# Patient Record
Sex: Male | Born: 2000 | Race: Black or African American | Hispanic: No | Marital: Single | Smoking: Never smoker
Health system: Southern US, Community
[De-identification: ages and names within clinical notes are randomized; demographics above are authoritative.]

## PROBLEM LIST (undated history)

## (undated) DIAGNOSIS — T7840XA Allergy, unspecified, initial encounter: Secondary | ICD-10-CM

## (undated) DIAGNOSIS — J45909 Unspecified asthma, uncomplicated: Secondary | ICD-10-CM

## (undated) DIAGNOSIS — J4530 Mild persistent asthma, uncomplicated: Secondary | ICD-10-CM

## (undated) DIAGNOSIS — R51 Headache: Secondary | ICD-10-CM

## (undated) DIAGNOSIS — E739 Lactose intolerance, unspecified: Secondary | ICD-10-CM

## (undated) DIAGNOSIS — F329 Major depressive disorder, single episode, unspecified: Secondary | ICD-10-CM

## (undated) DIAGNOSIS — F32A Depression, unspecified: Secondary | ICD-10-CM

## (undated) DIAGNOSIS — F419 Anxiety disorder, unspecified: Secondary | ICD-10-CM

## (undated) HISTORY — DX: Allergy, unspecified, initial encounter: T78.40XA

## (undated) HISTORY — DX: Anxiety disorder, unspecified: F41.9

## (undated) HISTORY — PX: CIRCUMCISION: SUR203

## (undated) HISTORY — DX: Depression, unspecified: F32.A

## (undated) HISTORY — DX: Mild persistent asthma, uncomplicated: J45.30

## (undated) HISTORY — DX: Unspecified asthma, uncomplicated: J45.909

## (undated) HISTORY — DX: Headache: R51

---
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
## 2022-10-07 ENCOUNTER — Emergency Department (HOSPITAL_COMMUNITY)
Admission: EM | Admit: 2022-10-07 | Discharge: 2022-10-07 | Disposition: A | Discharge status: Home / Self Care | Payer: 59 | Attending: Emergency Medicine | Admitting: Emergency Medicine

## 2022-10-07 ENCOUNTER — Emergency Department (HOSPITAL_COMMUNITY): Payer: 59

## 2022-10-07 ENCOUNTER — Encounter (HOSPITAL_COMMUNITY): Payer: Self-pay

## 2022-10-07 ENCOUNTER — Other Ambulatory Visit: Payer: Self-pay

## 2022-10-07 DIAGNOSIS — Z7951 Long term (current) use of inhaled steroids: Secondary | ICD-10-CM | POA: Insufficient documentation

## 2022-10-07 DIAGNOSIS — M25561 Pain in right knee: Secondary | ICD-10-CM | POA: Diagnosis not present

## 2022-10-07 DIAGNOSIS — J45909 Unspecified asthma, uncomplicated: Secondary | ICD-10-CM | POA: Insufficient documentation

## 2022-10-07 DIAGNOSIS — M25562 Pain in left knee: Secondary | ICD-10-CM | POA: Insufficient documentation

## 2022-10-07 DIAGNOSIS — Y92410 Unspecified street and highway as the place of occurrence of the external cause: Secondary | ICD-10-CM | POA: Insufficient documentation

## 2022-10-07 DIAGNOSIS — R1031 Right lower quadrant pain: Secondary | ICD-10-CM | POA: Insufficient documentation

## 2022-10-07 DIAGNOSIS — G8911 Acute pain due to trauma: Secondary | ICD-10-CM | POA: Diagnosis not present

## 2022-10-07 DIAGNOSIS — S39012A Strain of muscle, fascia and tendon of lower back, initial encounter: Secondary | ICD-10-CM

## 2022-10-07 DIAGNOSIS — Z91010 Allergy to peanuts: Secondary | ICD-10-CM | POA: Insufficient documentation

## 2022-10-07 DIAGNOSIS — S161XXA Strain of muscle, fascia and tendon at neck level, initial encounter: Secondary | ICD-10-CM | POA: Diagnosis not present

## 2022-10-07 DIAGNOSIS — M546 Pain in thoracic spine: Secondary | ICD-10-CM | POA: Diagnosis not present

## 2022-10-07 DIAGNOSIS — M542 Cervicalgia: Secondary | ICD-10-CM | POA: Diagnosis present

## 2022-10-07 LAB — BASIC METABOLIC PANEL
Anion gap: 7 (ref 5–15)
BUN: 15 mg/dL (ref 6–20)
CO2: 27 mmol/L (ref 22–32)
Calcium: 9.5 mg/dL (ref 8.9–10.3)
Chloride: 104 mmol/L (ref 98–111)
Creatinine, Ser: 1.08 mg/dL (ref 0.61–1.24)
GFR, Estimated: >60 mL/min (ref >60)
Glucose, Bld: 89 mg/dL (ref 70–99)
Potassium: 3.9 mmol/L (ref 3.5–5.1)
Sodium: 138 mmol/L (ref 135–145)

## 2022-10-07 LAB — CBC WITH DIFFERENTIAL/PLATELET
Abs Immature Granulocytes: 0.01 10*3/uL (ref 0.00–0.07)
Basophils Absolute: 0 10*3/uL (ref 0.0–0.1)
Basophils Relative: 1 %
Eosinophils Absolute: 0 10*3/uL (ref 0.0–0.5)
Eosinophils Relative: 1 %
HCT: 46.5 % (ref 39.0–52.0)
Hemoglobin: 15.8 g/dL (ref 13.0–17.0)
Immature Granulocytes: 0 %
Lymphocytes Relative: 27 %
Lymphs Abs: 1.3 10*3/uL (ref 0.7–4.0)
MCH: 27.6 pg (ref 26.0–34.0)
MCHC: 34 g/dL (ref 30.0–36.0)
MCV: 81.3 fL (ref 80.0–100.0)
Monocytes Absolute: 0.3 10*3/uL (ref 0.1–1.0)
Monocytes Relative: 7 %
Neutro Abs: 3.3 10*3/uL (ref 1.7–7.7)
Neutrophils Relative %: 64 %
Platelets: 222 10*3/uL (ref 150–400)
RBC: 5.72 MIL/uL (ref 4.22–5.81)
RDW: 12.9 % (ref 11.5–15.5)
WBC: 5 10*3/uL (ref 4.0–10.5)
nRBC: 0 % (ref 0.0–0.2)

## 2022-10-07 MED ORDER — NAPROXEN 500 MG PO TABS: 500.0000 mg | ORAL_TABLET | Freq: Two times a day (BID) | ORAL | 0 refills | Status: DC

## 2022-10-07 MED ORDER — MORPHINE SULFATE (PF) 2 MG/ML IV SOLN
2.0000 mg | Freq: Once | INTRAVENOUS | Status: AC
  Administered 2022-10-07: 2 mg via INTRAVENOUS
  Filled 2022-10-07: qty 1

## 2022-10-07 MED ORDER — HYDROCODONE-ACETAMINOPHEN 5-325 MG PO TABS: 1.0000 | ORAL_TABLET | ORAL | 0 refills | Status: DC | PRN

## 2022-10-07 MED ORDER — ONDANSETRON HCL 4 MG/2ML IJ SOLN
4.0000 mg | Freq: Once | INTRAMUSCULAR | Status: AC
  Administered 2022-10-07: 4 mg via INTRAVENOUS
  Filled 2022-10-07: qty 2

## 2022-10-07 MED ORDER — HYDROCODONE-ACETAMINOPHEN 5-325 MG PO TABS
1.0000 | ORAL_TABLET | Freq: Once | ORAL | Status: AC
  Administered 2022-10-07: 1 via ORAL
  Filled 2022-10-07: qty 1

## 2022-10-07 MED ORDER — IOHEXOL 300 MG/ML  SOLN
100.0000 mL | Freq: Once | INTRAMUSCULAR | Status: AC | PRN
  Administered 2022-10-07: 100 mL via INTRAVENOUS

## 2022-10-07 NOTE — ED Notes (Signed)
Patient transported to CT 
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
## 2022-10-07 NOTE — Discharge Instructions (Addendum)
Prepack of hydrocodone provided to help with more severe pain.  Once not taking hydrocodone recommend extra strength Tylenol to be taken along with the Naprosyn.  You can take the Naprosyn along with the hydrocodone.  Would recommend the Naprosyn every 12 hours and the extra strength Tylenol every 8 hours.  Expect to be sore and stiff the next few days.  If the knees are still bothering you after about a week orthopedic information locally provided above it can call and set up an appointment.  Head CT, CT neck, CT chest abdomen and pelvis and x-rays of both knees without evidence of any bony injury or internal organ injury.

## 2022-10-07 NOTE — ED Notes (Signed)
Pt alert, oriented upon DC. He was given the vicodin prepack.  [place] Heath Gerrianne Scale

## 2022-10-07 NOTE — ED Notes (Signed)
ED Provider at bedside. 

## 2022-10-08 MED FILL — Hydrocodone-Acetaminophen Tab 5-325 MG: ORAL | Qty: 6 | Status: AC

## 2023-11-01 ENCOUNTER — Other Ambulatory Visit: Payer: Self-pay

## 2023-11-01 ENCOUNTER — Encounter (HOSPITAL_BASED_OUTPATIENT_CLINIC_OR_DEPARTMENT_OTHER): Payer: Self-pay | Admitting: Orthopedic Surgery

## 2023-11-07 NOTE — Anesthesia Preprocedure Evaluation (Addendum)
Anesthesia Evaluation  Patient identified by MRN, date of birth, ID band Patient awake    Reviewed: Allergy & Precautions, NPO status , Patient's Chart, lab work & pertinent test results  Airway Mallampati: II  TM Distance: >3 FB Neck ROM: Full    Dental  (+) Teeth Intact, Dental Advisory Given   Pulmonary asthma    breath sounds clear to auscultation       Cardiovascular negative cardio ROS  Rhythm:Regular Rate:Normal     Neuro/Psych  Headaches PSYCHIATRIC DISORDERS Anxiety Depression       GI/Hepatic negative GI ROS, Neg liver ROS,,,  Endo/Other  negative endocrine ROS    Renal/GU negative Renal ROS     Musculoskeletal   Abdominal   Peds  Hematology negative hematology ROS (+)   Anesthesia Other Findings   Reproductive/Obstetrics                             Anesthesia Physical Anesthesia Plan  ASA: 1  Anesthesia Plan: General   Post-op Pain Management: Regional block*   Induction: Intravenous  PONV Risk Score and Plan: 3 and Ondansetron, Dexamethasone and Midazolam  Airway Management Planned: LMA  Additional Equipment: None  Intra-op Plan:   Post-operative Plan: Extubation in OR  Informed Consent: I have reviewed the patients History and Physical, chart, labs and discussed the procedure including the risks, benefits and alternatives for the proposed anesthesia with the patient or authorized representative who has indicated his/her understanding and acceptance.     Dental advisory given  Plan Discussed with: CRNA  Anesthesia Plan Comments:        Anesthesia Quick Evaluation

## 2023-11-08 ENCOUNTER — Ambulatory Visit (HOSPITAL_BASED_OUTPATIENT_CLINIC_OR_DEPARTMENT_OTHER): Payer: 59 | Admitting: Anesthesiology

## 2023-11-08 ENCOUNTER — Other Ambulatory Visit: Payer: Self-pay

## 2023-11-08 ENCOUNTER — Ambulatory Visit (HOSPITAL_BASED_OUTPATIENT_CLINIC_OR_DEPARTMENT_OTHER)
Admission: RE | Admit: 2023-11-08 | Discharge: 2023-11-08 | Disposition: A | Discharge status: Home / Self Care | Payer: 59 | Attending: Orthopedic Surgery | Admitting: Orthopedic Surgery

## 2023-11-08 ENCOUNTER — Encounter (HOSPITAL_BASED_OUTPATIENT_CLINIC_OR_DEPARTMENT_OTHER)
Admission: RE | Disposition: A | Discharge status: Home / Self Care | Payer: Self-pay | Source: Home / Self Care | Attending: Orthopedic Surgery

## 2023-11-08 ENCOUNTER — Ambulatory Visit (HOSPITAL_COMMUNITY): Payer: 59

## 2023-11-08 ENCOUNTER — Encounter (HOSPITAL_BASED_OUTPATIENT_CLINIC_OR_DEPARTMENT_OTHER): Payer: Self-pay | Admitting: Orthopedic Surgery

## 2023-11-08 DIAGNOSIS — M222X2 Patellofemoral disorders, left knee: Secondary | ICD-10-CM | POA: Diagnosis present

## 2023-11-08 DIAGNOSIS — M92522 Juvenile osteochondrosis of tibia tubercle, left leg: Secondary | ICD-10-CM | POA: Insufficient documentation

## 2023-11-08 DIAGNOSIS — J4530 Mild persistent asthma, uncomplicated: Secondary | ICD-10-CM | POA: Diagnosis not present

## 2023-11-08 DIAGNOSIS — M7652 Patellar tendinitis, left knee: Secondary | ICD-10-CM | POA: Diagnosis not present

## 2023-11-08 DIAGNOSIS — M9242 Juvenile osteochondrosis of patella, left knee: Secondary | ICD-10-CM | POA: Insufficient documentation

## 2023-11-08 HISTORY — PX: ORIF PATELLA: SHX5033

## 2023-11-08 SURGERY — OPEN REDUCTION INTERNAL FIXATION (ORIF) PATELLA
Anesthesia: General | Site: Knee | Laterality: Left

## 2023-11-08 MED ORDER — SODIUM CHLORIDE 0.9 % IV SOLN: INTRAVENOUS | Status: DC | PRN

## 2023-11-08 MED ORDER — DEXMEDETOMIDINE HCL IN NACL 80 MCG/20ML IV SOLN
INTRAVENOUS | Status: DC | PRN
  Administered 2023-11-08: 12 ug via INTRAVENOUS

## 2023-11-08 MED ORDER — ACETAMINOPHEN 160 MG/5ML PO SOLN: 325.0000 mg | ORAL | Status: DC | PRN

## 2023-11-08 MED ORDER — LIDOCAINE 2% (20 MG/ML) 5 ML SYRINGE
INTRAMUSCULAR | Status: AC
Start: 2023-11-08 — End: ?
  Filled 2023-11-08: qty 5

## 2023-11-08 MED ORDER — ACETAMINOPHEN 325 MG PO TABS: 325.0000 mg | ORAL_TABLET | ORAL | Status: DC | PRN

## 2023-11-08 MED ORDER — MIDAZOLAM HCL 2 MG/2ML IJ SOLN
2.0000 mg | Freq: Once | INTRAMUSCULAR | Status: AC
  Administered 2023-11-08: 2 mg via INTRAVENOUS

## 2023-11-08 MED ORDER — MIDAZOLAM HCL 2 MG/2ML IJ SOLN
INTRAMUSCULAR | Status: AC
  Filled 2023-11-08: qty 2

## 2023-11-08 MED ORDER — ONDANSETRON HCL 4 MG PO TABS: 4.0000 mg | ORAL_TABLET | Freq: Three times a day (TID) | ORAL | 0 refills | Status: DC | PRN

## 2023-11-08 MED ORDER — ONDANSETRON HCL 4 MG/2ML IJ SOLN
INTRAMUSCULAR | Status: DC | PRN
  Administered 2023-11-08: 4 mg via INTRAVENOUS

## 2023-11-08 MED ORDER — OXYCODONE HCL 5 MG/5ML PO SOLN: 5.0000 mg | Freq: Once | ORAL | Status: AC | PRN

## 2023-11-08 MED ORDER — ACETAMINOPHEN 10 MG/ML IV SOLN
1000.0000 mg | Freq: Once | INTRAVENOUS | Status: DC | PRN
Start: 2023-11-08 — End: 2023-11-08
  Administered 2023-11-08: 1000 mg via INTRAVENOUS

## 2023-11-08 MED ORDER — FENTANYL CITRATE (PF) 100 MCG/2ML IJ SOLN
INTRAMUSCULAR | Status: AC
  Filled 2023-11-08: qty 2

## 2023-11-08 MED ORDER — FENTANYL CITRATE (PF) 100 MCG/2ML IJ SOLN: 25.0000 ug | INTRAMUSCULAR | Status: DC | PRN

## 2023-11-08 MED ORDER — LACTATED RINGERS IV SOLN: INTRAVENOUS | Status: DC

## 2023-11-08 MED ORDER — FENTANYL CITRATE (PF) 100 MCG/2ML IJ SOLN
INTRAMUSCULAR | Status: DC | PRN
  Administered 2023-11-08 (×2): 50 ug via INTRAVENOUS

## 2023-11-08 MED ORDER — DEXAMETHASONE SODIUM PHOSPHATE 10 MG/ML IJ SOLN
INTRAMUSCULAR | Status: AC
  Filled 2023-11-08: qty 1

## 2023-11-08 MED ORDER — 0.9 % SODIUM CHLORIDE (POUR BTL) OPTIME
TOPICAL | Status: DC | PRN
  Administered 2023-11-08: 1000 mL

## 2023-11-08 MED ORDER — HYDROCODONE-ACETAMINOPHEN 5-325 MG PO TABS: 1.0000 | ORAL_TABLET | ORAL | 0 refills | Status: DC | PRN

## 2023-11-08 MED ORDER — DEXMEDETOMIDINE HCL IN NACL 80 MCG/20ML IV SOLN
INTRAVENOUS | Status: AC
  Filled 2023-11-08: qty 20

## 2023-11-08 MED ORDER — LIDOCAINE 2% (20 MG/ML) 5 ML SYRINGE
INTRAMUSCULAR | Status: DC | PRN
  Administered 2023-11-08: 60 mg via INTRAVENOUS

## 2023-11-08 MED ORDER — CEFAZOLIN SODIUM-DEXTROSE 2-4 GM/100ML-% IV SOLN
INTRAVENOUS | Status: AC
Start: 2023-11-08 — End: ?
  Filled 2023-11-08: qty 100

## 2023-11-08 MED ORDER — OXYCODONE HCL 5 MG PO TABS
5.0000 mg | ORAL_TABLET | Freq: Once | ORAL | Status: AC | PRN
  Administered 2023-11-08: 5 mg via ORAL

## 2023-11-08 MED ORDER — PROPOFOL 10 MG/ML IV BOLUS
INTRAVENOUS | Status: DC | PRN
  Administered 2023-11-08: 200 mg via INTRAVENOUS

## 2023-11-08 MED ORDER — FENTANYL CITRATE (PF) 100 MCG/2ML IJ SOLN
50.0000 ug | Freq: Once | INTRAMUSCULAR | Status: AC
  Administered 2023-11-08: 50 ug via INTRAVENOUS

## 2023-11-08 MED ORDER — ACETAMINOPHEN 10 MG/ML IV SOLN
INTRAVENOUS | Status: AC
  Filled 2023-11-08: qty 100

## 2023-11-08 MED ORDER — ONDANSETRON HCL 4 MG/2ML IJ SOLN
INTRAMUSCULAR | Status: AC
  Filled 2023-11-08: qty 2

## 2023-11-08 MED ORDER — OXYCODONE HCL 5 MG PO TABS
ORAL_TABLET | ORAL | Status: AC
  Filled 2023-11-08: qty 1

## 2023-11-08 MED ORDER — CEFAZOLIN SODIUM-DEXTROSE 2-4 GM/100ML-% IV SOLN
2.0000 g | INTRAVENOUS | Status: AC
  Administered 2023-11-08: 2 g via INTRAVENOUS

## 2023-11-08 MED ORDER — DROPERIDOL 2.5 MG/ML IJ SOLN: 0.6250 mg | Freq: Once | INTRAMUSCULAR | Status: DC | PRN

## 2023-11-08 MED ORDER — DEXAMETHASONE SODIUM PHOSPHATE 10 MG/ML IJ SOLN
INTRAMUSCULAR | Status: DC | PRN
  Administered 2023-11-08: 10 mg via INTRAVENOUS

## 2023-11-08 SURGICAL SUPPLY — 66 items
ANCHOR SUT 1.8 FIBERTAK SB KL (Anchor) IMPLANT
BANDAGE ESMARK 6X9 LF (GAUZE/BANDAGES/DRESSINGS) ×2 IMPLANT
BLADE SURG 15 STRL LF DISP TIS (BLADE) ×4 IMPLANT
BNDG ELASTIC 4INX 5YD STR LF (GAUZE/BANDAGES/DRESSINGS) ×2 IMPLANT
BNDG ELASTIC 6INX 5YD STR LF (GAUZE/BANDAGES/DRESSINGS) ×2 IMPLANT
BNDG ESMARK 6X9 LF (GAUZE/BANDAGES/DRESSINGS) ×1
BNDG GAUZE DERMACEA FLUFF 4 (GAUZE/BANDAGES/DRESSINGS) IMPLANT
CANISTER SUCT 1200ML W/VALVE (MISCELLANEOUS) ×2 IMPLANT
CLSR STERI-STRIP ANTIMIC 1/2X4 (GAUZE/BANDAGES/DRESSINGS) IMPLANT
CUFF TRNQT CYL 34X4.125X (TOURNIQUET CUFF) IMPLANT
DRAPE C-ARM 42X72 X-RAY (DRAPES) ×2 IMPLANT
DRAPE C-ARMOR (DRAPES) ×2 IMPLANT
DRAPE EXTREMITY T 121X128X90 (DISPOSABLE) ×2 IMPLANT
DRAPE INCISE IOBAN 66X45 STRL (DRAPES) IMPLANT
DRAPE OEC MINIVIEW 54X84 (DRAPES) IMPLANT
DRAPE U-SHAPE 47X51 STRL (DRAPES) ×2 IMPLANT
DRSG AQUACEL AG ADV 3.5X 6 (GAUZE/BANDAGES/DRESSINGS) IMPLANT
DRSG AQUACEL AG ADV 3.5X10 (GAUZE/BANDAGES/DRESSINGS) IMPLANT
DURAPREP 26ML APPLICATOR (WOUND CARE) ×2 IMPLANT
ELECT REM PT RETURN 9FT ADLT (ELECTROSURGICAL) ×1
ELECTRODE REM PT RTRN 9FT ADLT (ELECTROSURGICAL) ×2 IMPLANT
GAUZE PAD ABD 8X10 STRL (GAUZE/BANDAGES/DRESSINGS) IMPLANT
GAUZE SPONGE 4X4 12PLY STRL (GAUZE/BANDAGES/DRESSINGS) ×2 IMPLANT
GLOVE BIO SURGEON STRL SZ7.5 (GLOVE) ×4 IMPLANT
GLOVE BIOGEL PI IND STRL 8 (GLOVE) ×4 IMPLANT
GOWN STRL REUS W/ TWL LRG LVL3 (GOWN DISPOSABLE) ×2 IMPLANT
GOWN STRL REUS W/ TWL XL LVL3 (GOWN DISPOSABLE) ×4 IMPLANT
IMMOBILIZER KNEE 22 UNIV (SOFTGOODS) IMPLANT
IMMOBILIZER KNEE 24 THIGH 36 (MISCELLANEOUS) IMPLANT
IMMOBILIZER KNEE 24 UNIV (MISCELLANEOUS)
KIT STR SPEAR 1.8 FBRTK DISP (KITS) IMPLANT
NDL KEITH (NEEDLE) IMPLANT
NDL TAPERED W/ NITINOL LOOP (MISCELLANEOUS) IMPLANT
NEEDLE KEITH (NEEDLE)
NEEDLE TAPERED W/ NITINOL LOOP (MISCELLANEOUS) ×1
NS IRRIG 1000ML POUR BTL (IV SOLUTION) ×2 IMPLANT
PACK ARTHROSCOPY DSU (CUSTOM PROCEDURE TRAY) ×2 IMPLANT
PACK BASIN DAY SURGERY FS (CUSTOM PROCEDURE TRAY) ×2 IMPLANT
PAD CAST 4YDX4 CTTN HI CHSV (CAST SUPPLIES) ×2 IMPLANT
PADDING CAST ABS COTTON 4X4 ST (CAST SUPPLIES) ×2 IMPLANT
PASSER SUT SWANSON 36MM LOOP (INSTRUMENTS) IMPLANT
PENCIL SMOKE EVACUATOR (MISCELLANEOUS) ×2 IMPLANT
SLEEVE SCD COMPRESS KNEE MED (STOCKING) ×2 IMPLANT
SPIKE FLUID TRANSFER (MISCELLANEOUS) IMPLANT
SPONGE T-LAP 18X18 ~~LOC~~+RFID (SPONGE) ×2 IMPLANT
STAPLER SKIN PROX WIDE 3.9 (STAPLE) IMPLANT
STRIP CLOSURE SKIN 1/2X4 (GAUZE/BANDAGES/DRESSINGS) IMPLANT
SUCTION TUBE FRAZIER 10FR DISP (SUCTIONS) ×2 IMPLANT
SUT ETHILON 3 0 PS 1 (SUTURE) IMPLANT
SUT ETHILON 4 0 PS 2 18 (SUTURE) IMPLANT
SUT FIBERWIRE #2 38 T-5 BLUE (SUTURE)
SUT FIBERWIRE #5 38 CONV NDL (SUTURE)
SUT MNCRL AB 3-0 PS2 18 (SUTURE) ×2 IMPLANT
SUT MNCRL AB 4-0 PS2 18 (SUTURE) IMPLANT
SUT VIC AB 0 CT1 27XBRD ANBCTR (SUTURE) ×2 IMPLANT
SUT VIC AB 2-0 CT1 TAPERPNT 27 (SUTURE) ×2 IMPLANT
SUT VIC AB 2-0 SH 18 (SUTURE) IMPLANT
SUT VIC AB 2-0 SH 27XBRD (SUTURE) IMPLANT
SUT VIC AB 3-0 SH 27X BRD (SUTURE) IMPLANT
SUT VIC AB 4-0 PS2 18 (SUTURE) IMPLANT
SUT VICRYL 3-0 CR8 SH (SUTURE) IMPLANT
SUTURE FIBERWR #2 38 T-5 BLUE (SUTURE) IMPLANT
SUTURE FIBERWR #5 38 CONV NDL (SUTURE) IMPLANT
SYR BULB EAR ULCER 3OZ GRN STR (SYRINGE) ×2 IMPLANT
UNDERPAD 30X36 HEAVY ABSORB (UNDERPADS AND DIAPERS) ×2 IMPLANT
YANKAUER SUCT BULB TIP NO VENT (SUCTIONS) ×2 IMPLANT

## 2023-11-08 NOTE — Transfer of Care (Signed)
Immediate Anesthesia Transfer of Care Note  Patient: [name]  Procedure(s) Performed: KNEE OPEN PATELLA DEBRIDEMENT WITH TIBIAL AND PATELLA OSTECTOMY (Left: Knee)  Patient Location: PACU  Anesthesia Type:General and GA combined with regional for post-op pain  Level of Consciousness: drowsy, patient cooperative, and responds to stimulation  Airway & Oxygen Therapy: Patient Spontanous Breathing  Post-op Assessment: Report given to RN and Post -op Vital signs reviewed and stable  Post vital signs: Reviewed and stable  Last Vitals:  Vitals Value Taken Time  BP 133/74 11/08/23 0845  Temp 36.3 C 11/08/23 0845  Pulse 83 11/08/23 0851  Resp 0 11/08/23 0851  SpO2 100 % 11/08/23 0851  Vitals shown include unfiled device data.  Last Pain:  Vitals:   11/08/23 0845  TempSrc:   PainSc: Asleep      Patients Stated Pain Goal: 4 (11/08/23 7846)  Complications: No notable events documented.

## 2023-11-08 NOTE — Progress Notes (Signed)
Patient requested bone from knee, verified with charge nurse and it is ok as the bone is patients. Bone delivered to PACU nurse in a cup with lid and in bag

## 2023-11-08 NOTE — Anesthesia Procedure Notes (Signed)
Procedure Name: LMA Insertion Date/Time: 11/08/2023 7:42 AM  Performed by: Yolanda Bonine, CRNAPre-anesthesia Checklist: Patient identified, Emergency Drugs available, Suction available, Patient being monitored and Timeout performed Patient Re-evaluated:Patient Re-evaluated prior to induction Oxygen Delivery Method: Circle system utilized Preoxygenation: Pre-oxygenation with 100% oxygen Induction Type: IV induction Ventilation: Mask ventilation without difficulty LMA: LMA inserted LMA Size: 5.0 Dental Injury: Teeth and Oropharynx as per pre-operative assessment

## 2023-11-08 NOTE — Op Note (Signed)
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  Now dealing with persistent pain at the inferior pole of the patella as well as tibial tubercle from persistent inflammation of those regions.  Here today for open debridement as well as bone excision.;  The patient did consent to the procedure after discussion of the risks and benefits.  PREOPERATIVE DIAGNOSIS:  1.  Left knee inferior pole of patella symptomatic ossicle 2.  Left knee proximal tibia symptomatic ossicle at patella tendon enthesis 3.  Left knee patella tendinosis  POSTOPERATIVE DIAGNOSIS: Same.  PROCEDURE:  1.  Left knee open inferior pole of patella ostectomy 2.  Left knee open proximal tibia ostectomy 3.  Left knee open patellar tendon debridement  SURGEON: Maryan Rued, M.D.  ASSIST: Dion Saucier, PA-C  Assistant attestation:  PA McClung present for the entire procedure..  ANESTHESIA:  general, adductor canal  IV FLUIDS AND URINE: See anesthesia.  ESTIMATED BLOOD LOSS: 5 cc  IMPLANTS: One 1.8 mm Arthrex fiber tack knotless anchor  DRAINS: None  COMPLICATIONS: None.  DESCRIPTION OF PROCEDURE: The patient was brought to the operating room and placed supine on the operating table.  The patient had been signed prior to the procedure and this was documented. The patient had the anesthesia placed by the anesthesiologist.  A time-out was performed to confirm that this was the correct patient, site, side and location. The patient did receive antibiotics prior to the incision and was re-dosed during the procedure as needed at indicated intervals.  A tourniquet was placed.  The patient had the operative extremity prepped and draped in the standard surgical fashion.     Following prep and drape of the left lower extremity tourniquet was inflated.  We then established a slightly  anterior medial incision along the patella tendon to avoid an anterior scar.  Dissection was carried down through skin and subcutaneous tissue to level of the extensor mechanism.  Peritenon was elevated sharply with a 15 blade.  This was preserved for later repair.  Next, we split the patella tendon in line with its fibers from the inferior pole of the patella to the proximal tibia.  We immediately encountered a large ossific fragment off the inferior pole of the patella.  This was noted to be joint to the patella with a synchondrosis.  We used a osteotome to to break free the synchondrosis at that level.  We then used a rondure to remove the inferior pole of the patella ossicle.  This completed the patella ostectomy  Next, we moved down our attention to the proximal tibia.  Again sharply with 15 blade we debrided removed patella tendon tissue from the ossific fragment of the proximal tibia.  We then identified this again connected with a synchondrosis to the proximal tibia.  Rondure was used to remove this en bloc.  This completed the proximal tibia ostectomy.  Next, we sharply debrided the distal and proximal patella tendon with 15 blade.  We removed any degenerative tissue.  We also elicited bleeding to the deep bone.  Along the tibial attachment we placed an anchor a 1.8 mm knotless fiber tack anchor at the apex of the tendon split.  We then used the working stitch to pass baseball style watertight closure to the distal tendon.  This created a nice closure to bone.  Lastly we began our closure.  We copiously irrigated the  wound with normal saline.  We then closed tendon with 2-0 Vicryl.  Peritenon closed with 3-0 running locking Vicryl.  Skin closed with 2-0 Vicryl for deep dermal layer and 3-0 Monocryl for skin.  The leg was cleaned and dried and standard sterile bandage was applied.  We was placed in a knee immobilizer.  He was extubated and awakened from general anesthetic in stable condition.  No  noted intraoperative complications.  All counts were correct x 2.  POSTOPERATIVE PLAN:  Spencer Hall will be weightbearing as tolerated immediately.  However will be in his knee immobilizer for 1 to 2 weeks to protect the knee from buckling.  He can use crutches as necessary.  He will take an 81 mg aspirin once per day x 6 weeks.  I will see him in the office in 2 weeks.  Discharge home today.

## 2023-11-08 NOTE — Discharge Instructions (Addendum)
Post-operative patient instructions  Knee surgery  Ice:  Place intermittent ice or cooler pack over your knee, 30 minutes on and 30 minutes off.  Continue this for the first 72 hours after surgery, then save ice for use after therapy sessions or on more active days.   Weight:  You may bear weight on your leg as your symptoms allow. Knee brace: Wear your knee immobilizer for the first 1 week while weightbearing to prevent knee buckling. DVT prevention: Perform ankle pumps as able throughout the day on the operative extremity.  Be mobile as possible with ambulation as able.  You should also take an 81 mg aspirin once per day x6 weeks. Crutches:  Use crutches (or walker) to assist in walking until told to discontinue by your physical therapist or physician. This will help to reduce pain. Strengthening:  Perform simple thigh squeezes (isometric quad contractions) and straight leg lifts as you are able (3 sets of 5 to 10 repetitions, 3 times a day).  For the leg lifts, have someone support under your ankle in the beginning until you have increased strength enough to do this on your own.  To help get started on thigh squeezes, place a pillow under your knee and push down on the pillow with back of knee (sometimes easier to do than with your leg fully straight). Motion:  Perform gentle knee motion as tolerated - this is gentle bending and straightening of the knee. Seated heel slides: you can start by sitting in a chair, remove your brace, and gently slide your heel back on the floor - allowing your knee to bend. Have someone help you straighten your knee (or use your other leg/foot hooked under your ankle.  Dressing:  Perform 1st dressing change at 3 days postoperative. A moderate amount of blood tinged drainage is to be expected.  -Okay to begin showering postoperative day #3.  Please do not submerge underwater until you are seen back in the office for follow-up. Pain medication:  A narcotic pain medication  has been prescribed.  Take as directed.  Typically you need narcotic pain medication more regularly during the first 3 to 5 days after surgery.  Decrease your use of the medication as the pain improves.  Narcotics can sometimes cause constipation, even after a few doses.  If you have problems with constipation, you can take an over the counter stool softener or light laxative.  If you have persistent problems, please notify your physician's office. Physical therapy: Additional activity guidelines to be provided by your physician or physical therapist at follow-up visits.  Driving: Do not recommend driving x 1-2 weeks post surgical, especially if surgery performed on right side. Should not drive while taking narcotic pain medications. It typically takes at least 2 weeks to restore sufficient neuromuscular function for normal reaction times for driving safety.  Call [phone] for questions or problems. Evenings you will be forwarded to the hospital operator.  Ask for the orthopaedic physician on call. Please call if you experience:    Redness, foul smelling, or persistent drainage from the surgical site  worsening knee pain and swelling not responsive to medication  any calf pain and or swelling of the lower leg  temperatures greater than 101.5 F other questions or concerns   Thank you for allowing [place] to be a part of your care   Post Anesthesia Home Care Instructions  Activity: Get plenty of rest for the remainder of the day. A responsible individual must stay with you  for 24 hours following the procedure.  For the next 24 hours, DO NOT: -Drive a car -Advertising copywriter -Drink alcoholic beverages -Take any medication unless instructed by your physician -Make any legal decisions or sign important papers.  Meals: Start with liquid foods such as gelatin or soup. Progress to regular foods as tolerated. Avoid greasy, spicy, heavy foods. If nausea and/or vomiting occur, drink only clear liquids  until the nausea and/or vomiting subsides. Call your physician if vomiting continues.  Special Instructions/Symptoms: Your throat may feel dry or sore from the anesthesia or the breathing tube placed in your throat during surgery. If this causes discomfort, gargle with warm salt water. The discomfort should disappear within 24 hours.  Regional Anesthesia Blocks  1. You may not be able to move or feel the "blocked" extremity after a regional anesthetic block. This may last may last from 3-48 hours after placement, but it will go away. The length of time depends on the medication injected and your individual response to the medication. As the nerves start to wake up, you may experience tingling as the movement and feeling returns to your extremity. If the numbness and inability to move your extremity has not gone away after 48 hours, please call your surgeon.   2. The extremity that is blocked will need to be protected until the numbness is gone and the strength has returned. Because you cannot feel it, you will need to take extra care to avoid injury. Because it may be weak, you may have difficulty moving it or using it. You may not know what position it is in without looking at it while the block is in effect.  3. For blocks in the legs and feet, returning to weight bearing and walking needs to be done carefully. You will need to wait until the numbness is entirely gone and the strength has returned. You should be able to move your leg and foot normally before you try and bear weight or walk. You will need someone to be with you when you first try to ensure you do not fall and possibly risk injury.  4. Bruising and tenderness at the needle site are common side effects and will resolve in a few days.  5. Persistent numbness or new problems with movement should be communicated to the surgeon or the [facility] [phone] [facility] [phone]).

## 2023-11-08 NOTE — H&P (Signed)
ORTHOPAEDIC H and P  REQUESTING PHYSICIAN: Yolonda Kida, MD  PCP:  Pcp, No  Chief Complaint: Left knee pain  HPI: Spencer Hall is a 23 y.o. male who complains of left knee pain and difficulty with performing ADLS and sports.  Here today for surgery.  Past Medical History:  Diagnosis Date   Allergy    Anxiety    Phreesia 06/02/2020   Asthma    Phreesia 06/02/2020   Depression    Phreesia 06/02/2020   Headache(784.0)    Lactose intolerance    Mild persistent asthma    Radius fracture 03/08/2011   Past Surgical History:  Procedure Laterality Date   ADENOIDECTOMY  2004   CIRCUMCISION  2003   TONSILLECTOMY AND ADENOIDECTOMY Bilateral 2005   TYMPANOSTOMY TUBE PLACEMENT Bilateral 01/2011   Performed at [facility]   TYMPANOSTOMY TUBE PLACEMENT Bilateral 2006   Performed at [facility]   Social History   Socioeconomic History   Marital status: Single    Spouse name: Not on file   Number of children: Not on file   Years of education: Not on file   Highest education level: Not on file  Occupational History   Not on file  Tobacco Use   Smoking status: Never   Smokeless tobacco: Never  Vaping Use   Vaping status: Former  Substance and Sexual Activity   Alcohol use: No   Drug use: No   Sexual activity: Never  Other Topics Concern   Not on file  Social History Narrative   Spencer Hall is a 9th grade student.   He attends [employer].   He lives with his parents and brother.    He enjoys football, basketball, and drawing.    Social Drivers of Corporate investment banker Strain: Not on file  Food Insecurity: Not on file  Transportation Needs: Not on file  Physical Activity: Not on file  Stress: Not on file  Social Connections: Not on file   Family History  Problem Relation Age of Onset   Asthma Other    Diabetes Other    Allergies  Allergen Reactions   Lactose Intolerance (Gi) Other (See Comments)   Peanut-Containing Drug  Products Swelling   Prior to Admission medications   Medication Sig Start Date End Date Taking? Authorizing Provider  HYDROcodone-acetaminophen (NORCO/VICODIN) 5-325 MG tablet Take 1 tablet by mouth every 4 (four) hours as needed. 10/07/22   Vanetta Mulders, MD  naproxen (NAPROSYN) 500 MG tablet Take 1 tablet (500 mg total) by mouth 2 (two) times daily. 10/07/22   Vanetta Mulders, MD   No results found.  Positive ROS: All other systems have been reviewed and were otherwise negative with the exception of those mentioned in the HPI and as above.  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  - The risks, benefits, and alternatives were discussed with the patient. There are risks associated with the surgery including, but not limited to, problems with anesthesia (death), infection, differences in leg length/angulation/rotation, fracture of  bones,n, hematoma (blood accumulation) which may require surgical drainage, blood clots, pulmonary embolism, nerve injury (foot drop), and blood vessel injury. The patient understands these risks and elects to proceed.  - dc home post op   Yolonda Kida, MD Cell [phone]    11/08/2023 6:31 AM

## 2023-11-08 NOTE — Anesthesia Postprocedure Evaluation (Signed)
Anesthesia Post Note  Patient: Spencer Hall  Procedure(s) Performed: KNEE OPEN PATELLA DEBRIDEMENT WITH TIBIAL AND PATELLA OSTECTOMY (Left: Knee)     Patient location during evaluation: PACU Anesthesia Type: General Level of consciousness: awake and alert Pain management: pain level controlled Vital Signs Assessment: post-procedure vital signs reviewed and stable Respiratory status: spontaneous breathing, nonlabored ventilation, respiratory function stable and patient connected to nasal cannula oxygen Cardiovascular status: blood pressure returned to baseline and stable Postop Assessment: no apparent nausea or vomiting Anesthetic complications: no  No notable events documented.  Last Vitals:  Vitals:   11/08/23 0915 11/08/23 0942  BP: (!) 119/57 (!) 121/57  Pulse: 85 83  Resp: 13 20  Temp:  (!) 36.2 C  SpO2: 97% 97%    Last Pain:  Vitals:   11/08/23 0942  TempSrc: Temporal  PainSc: 3                  Shelton Silvas

## 2023-11-08 NOTE — Brief Op Note (Signed)
11/08/2023  8:27 AM  PATIENT:  Spencer Hall  23 y.o. male  PRE-OPERATIVE DIAGNOSIS:  Left knee osgood schlatter  POST-OPERATIVE DIAGNOSIS:  Left knee osgood schlatter  PROCEDURE:  Procedure(s): KNEE OPEN PATELLA DEBRIDEMENT WITH TIBIAL AND PATELLA OSTECTOMY (Left)  SURGEON:  Surgeons and Role:    * Yolonda Kida, MD - Primary  PHYSICIAN ASSISTANT: Dion Saucier, PA-C  ANESTHESIA:   regional and general  EBL:  5 mL   BLOOD ADMINISTERED:none  DRAINS: none   LOCAL MEDICATIONS USED:  NONE  SPECIMEN:  No Specimen  DISPOSITION OF SPECIMEN:  N/A  COUNTS:  YES  TOURNIQUET:  * Missing tourniquet times found for documented tourniquets in log: [id number] *  DICTATION: .Note written in EPIC  PLAN OF CARE: Discharge to home after PACU  PATIENT DISPOSITION:  PACU - hemodynamically stable.   Delay start of Pharmacological VTE agent (>24hrs) due to surgical blood loss or risk of bleeding: not applicable

## 2023-11-13 ENCOUNTER — Encounter (HOSPITAL_BASED_OUTPATIENT_CLINIC_OR_DEPARTMENT_OTHER): Payer: Self-pay | Admitting: Orthopedic Surgery

## 2024-02-26 ENCOUNTER — Encounter: Payer: Self-pay | Admitting: Allergy & Immunology

## 2024-02-26 ENCOUNTER — Ambulatory Visit: Admitting: Allergy & Immunology

## 2024-02-26 ENCOUNTER — Encounter: Admitting: Allergy & Immunology

## 2024-02-26 ENCOUNTER — Other Ambulatory Visit: Payer: Self-pay

## 2024-02-26 VITALS — BP 134/88 | HR 112 | Temp 96.4°F | Resp 18 | Ht 71.0 in | Wt 218.0 lb

## 2024-02-26 DIAGNOSIS — T7805XD Anaphylactic reaction due to tree nuts and seeds, subsequent encounter: Secondary | ICD-10-CM

## 2024-02-26 DIAGNOSIS — J302 Other seasonal allergic rhinitis: Secondary | ICD-10-CM

## 2024-02-26 DIAGNOSIS — J4530 Mild persistent asthma, uncomplicated: Secondary | ICD-10-CM | POA: Diagnosis not present

## 2024-02-26 DIAGNOSIS — T7801XD Anaphylactic reaction due to peanuts, subsequent encounter: Secondary | ICD-10-CM | POA: Diagnosis not present

## 2024-02-26 DIAGNOSIS — J3089 Other allergic rhinitis: Secondary | ICD-10-CM

## 2024-02-26 NOTE — Progress Notes (Unsigned)
 NEW PATIENT  Date of Service/Encounter:  02/27/24  Consult requested by: Pcp, No   Assessment:   Mild persistent asthma, uncomplicated   Seasonal and perennial allergic rhinitis  Peanut-induced anaphylaxis  Anaphylaxis due to tree nut  Plan/Recommendations:   1. Mild persistent asthma, uncomplicated - Lung testing did look a bit lower, but it did improve with the albuterol  treatment. - We are going to start you on Trelegy one puff once daily for 1-2 weeks during flares. - Daily controller medication(s): NOTHING - Prior to physical activity: albuterol  2 puffs 10-15 minutes before physical activity. - Rescue medications: albuterol  4 puffs every 4-6 hours as needed - Changes during respiratory infections or worsening symptoms: Add on Trelegy 100 1 puff once daily for ONE TO TWO WEEKS. - Asthma control goals:  * Full participation in all desired activities (may need albuterol  before activity) * Albuterol  use two time or less a week on average (not counting use with activity) * Cough interfering with sleep two time or less a month * Oral steroids no more than once a year * No hospitalizations  2. Seasonal and perennial allergic rhinitis - We are going to get some blood work to look for environmental allergies. - We will call you in 1-2 weeks with the results of the testing.  - Continue with Flonase  one spray per nostril twice daily. - Stop cetirizine  and start levocetirizine 5 mg up to twice daily. - We can consider allergy shots again in the future if needed.   3. Peanut and tree nut-induced anaphylaxis - We are rechecking peanut and tree nut allergy levels. - We are going to send in a nasal epinephrine. - We will call you in 1-2 weeks with the results of the testing.   4. Return in about 2 months (around 04/27/2024). You can have the follow up appointment with Dr. Idolina Maker or a Nurse Practicioner (our Nurse Practitioners are excellent and always have Physician  oversight!).    This note in its entirety was forwarded to the Provider who requested this consultation.  Subjective:   KAMAN FRONDA is a 23 y.o. male presenting today for evaluation of No chief complaint on file.   [name] has a history of the following: Patient Active Problem List   Diagnosis Date Noted   Other insomnia 01/06/2020   Major depressive disorder, recurrent episode, mild (HCC) 07/24/2019   Mild persistent asthma 05/27/2016   Allergic rhinitis    S/P tympanostomy tube placement 06/23/2015   Migraine without aura and without status migrainosus, not intractable 02/15/2015   Episodic tension type headache 04/06/2013    History obtained from: chart review and patient.  Discussed the use of AI scribe software for clinical note transcription with the patient and/or guardian, who gave verbal consent to proceed.  [name] was referred by Pcp, No.      Hossam is a 23 y.o. male presenting for an evaluation of allergies and asthma.  He was actually previously followed in our practice.  He was on allergy shots and last received them in 2017.   Asthma/Respiratory Symptom History: He experiences shortness of breath, which he initially attributed to decreased physical activity following knee surgery for an extra bone. He has not returned to playing basketball since the surgery. He has a history of asthma and previously used medications such as Qvar  and a red and white inhaler, as well as two different blue inhalers. Currently, he uses Trelegy once daily and feels 'great' with  this medication, though he occasionally experiences shortness of breath, which he associates with stopping his workouts post-surgery.  Allergic Rhinitis Symptom History: He has a history of allergic rhinitis, which worsened after moving. He previously received allergy shots for about a year, which were effective at the time. He uses a nasal spray twice daily, increasing to twice daily during bad  periods. He reports frequent sinus infections, approximately four to five times a year, for which he has required antibiotics. He has a deviated septum, which may contribute to his sinus issues.  Food Allergy Symptom History: He has a peanut and nut allergy, which he actively avoids. He recalls an incident six months ago where he had an allergic reaction after accidental exposure, resulting in itching. No recent accidental exposures to allergens.  His twin brother also has asthma, which is severe enough to require emergency medical attention. His asthma was not as severe as his brother's during his high school years. 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 He plans to move back to his hometown in August and graduate in December.  Otherwise, there is no history of other atopic diseases, including drug allergies, stinging insect allergies, or contact dermatitis. There is no significant infectious history. Vaccinations are up to date.    Past Medical History: Patient Active Problem List   Diagnosis Date Noted   Other insomnia 01/06/2020   Major depressive disorder, recurrent episode, mild (HCC) 07/24/2019   Mild persistent asthma 05/27/2016   Allergic rhinitis    S/P tympanostomy tube placement 06/23/2015   Migraine without aura and without status migrainosus, not intractable 02/15/2015   Episodic tension type headache 04/06/2013    Medication List:  Allergies as of 02/26/2024       Reactions   Lactose Intolerance (gi) Other (See Comments)   Peanut-containing Drug Products Swelling        Medication List        Accurate as of Feb 26, 2024 11:59 PM. If you have any questions, ask your nurse or doctor.          HYDROcodone -acetaminophen  5-325 MG tablet Commonly known as: NORCO/VICODIN Take 1 tablet by mouth every 4 (four) hours as needed for moderate pain (pain score 4-6).   HYDROcodone -acetaminophen  5-325 MG tablet Commonly known as: NORCO/VICODIN Take 1  tablet by mouth every 4 (four) hours as needed for moderate pain (pain score 4-6) or severe pain (pain score 7-10).   naproxen  500 MG tablet Commonly known as: NAPROSYN  Take 1 tablet (500 mg total) by mouth 2 (two) times daily.   ondansetron  4 MG tablet Commonly known as: Zofran  Take 1 tablet (4 mg total) by mouth every 8 (eight) hours as needed for vomiting or nausea.        Birth History: non-contributory  Developmental History: non-contributory  Past Surgical History: Past Surgical History:  Procedure Laterality Date   ADENOIDECTOMY  2004   CIRCUMCISION  2003   ORIF PATELLA Left 11/08/2023   Procedure: KNEE OPEN PATELLA DEBRIDEMENT WITH TIBIAL AND PATELLA OSTECTOMY;  Surgeon: Janeth Medicus, MD;  Location: [facility];  Service: Orthopedics;  Laterality: Left;   TONSILLECTOMY AND ADENOIDECTOMY Bilateral 2005   TYMPANOSTOMY TUBE PLACEMENT Bilateral 01/2011   Performed at [facility]   TYMPANOSTOMY TUBE PLACEMENT Bilateral 2006   Performed at [facility]     Family History: Family History  Problem Relation Age of Onset   Asthma Other    Diabetes Other  Social History: Hershel lives at home with his family.  There is no smoking exposure.  There are no pets in the home.  There is no fume, chemical, or dust exposure.  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  His last semester is going to be remote.   Review of systems otherwise negative other than that mentioned in the HPI.    Objective:   Blood pressure 134/88, pulse (!) 112, temperature (!) 96.4 F (35.8 C), temperature source Temporal, resp. rate 18, height 5\' 11"  (1.803 m), weight 218 lb (98.9 kg), SpO2 98%. Body mass index is 30.4 kg/m.     Physical Exam Vitals reviewed.  Constitutional:      Appearance: He is well-developed.     Comments: Well mannered.  Friendly.  HENT:     Head: Normocephalic and  atraumatic.     Right Ear: Tympanic membrane, ear canal and external ear normal. No drainage, swelling or tenderness. Tympanic membrane is not injected, scarred, erythematous, retracted or bulging.     Left Ear: Tympanic membrane, ear canal and external ear normal. No drainage, swelling or tenderness. Tympanic membrane is not injected, scarred, erythematous, retracted or bulging.     Nose: No nasal deformity, septal deviation, mucosal edema or rhinorrhea.     Right Turbinates: Enlarged, swollen and pale.     Left Turbinates: Enlarged, swollen and pale.     Right Sinus: No maxillary sinus tenderness or frontal sinus tenderness.     Left Sinus: No maxillary sinus tenderness or frontal sinus tenderness.     Mouth/Throat:     Mouth: Mucous membranes are not pale and not dry.     Pharynx: Uvula midline.  Eyes:     General:        Right eye: No discharge.        Left eye: No discharge.     Conjunctiva/sclera: Conjunctivae normal.     Right eye: Right conjunctiva is not injected. No chemosis.    Left eye: Left conjunctiva is not injected. No chemosis.    Pupils: Pupils are equal, round, and reactive to light.  Cardiovascular:     Rate and Rhythm: Normal rate and regular rhythm.     Heart sounds: Normal heart sounds.  Pulmonary:     Effort: Pulmonary effort is normal. No tachypnea, accessory muscle usage or respiratory distress.     Breath sounds: Normal breath sounds. No wheezing, rhonchi or rales.     Comments: Moving air well in all lung fields.  No increased work of breathing. Chest:     Chest wall: No tenderness.  Abdominal:     Tenderness: There is no abdominal tenderness. There is no guarding or rebound.  Lymphadenopathy:     Head:     Right side of head: No submandibular, tonsillar or occipital adenopathy.     Left side of head: No submandibular, tonsillar or occipital adenopathy.     Cervical: No cervical adenopathy.  Skin:    General: Skin is warm.     Capillary Refill:  Capillary refill takes less than 2 seconds.     Coloration: Skin is not pale.     Findings: No abrasion, erythema, petechiae or rash. Rash is not papular, urticarial or vesicular.  Neurological:     Mental Status: He is alert.  Psychiatric:        Behavior: Behavior is cooperative.      Diagnostic studies:  Spirometry: results abnormal (FEV1: 3.13/76%, FVC: 4.58/94%, FEV1/FVC: 68%).    Spirometry consistent with mild obstructive disease. Xopenex four puffs via MDI treatment given in clinic with significant improvement in FEV1 per ATS criteria.  Allergy Studies: labs sent instead           Drexel Gentles, MD [facility] 

## 2024-02-26 NOTE — Patient Instructions (Addendum)
 1. Mild persistent asthma, uncomplicated - Lung testing did look a bit lower, but it did improve with the albuterol  treatment. - We are going to start you on Trelegy one puff once daily for 1-2 weeks during flares. - Daily controller medication(s): NOTHING - Prior to physical activity: albuterol  2 puffs 10-15 minutes before physical activity. - Rescue medications: albuterol  4 puffs every 4-6 hours as needed - Changes during respiratory infections or worsening symptoms: Add on Trelegy 100 1 puff once daily for ONE TO TWO WEEKS. - Asthma control goals:  * Full participation in all desired activities (may need albuterol  before activity) * Albuterol  use two time or less a week on average (not counting use with activity) * Cough interfering with sleep two time or less a month * Oral steroids no more than once a year * No hospitalizations  2. Seasonal and perennial allergic rhinitis - We are going to get some blood work to look for environmental allergies. - We will call you in 1-2 weeks with the results of the testing.  - Continue with Flonase  one spray per nostril twice daily. - Stop cetirizine  and start levocetirizine 5 mg up to twice daily. - We can consider allergy shots again in the future if needed.   3. Peanut and tree nut-induced anaphylaxis - We are rechecking peanut and tree nut allergy levels. - We are going to send in a nasal epinephrine. - We will call you in 1-2 weeks with the results of the testing.   4. Return in about 2 months (around 04/27/2024). You can have the follow up appointment with Dr. Idolina Maker or a Nurse Practicioner (our Nurse Practitioners are excellent and always have Physician oversight!).    Please inform us  of any Emergency Department visits, hospitalizations, or changes in symptoms. Call us  before going to the ED for breathing or allergy symptoms since we might be able to fit you in for a sick visit. Feel free to contact us  anytime with any questions,  problems, or concerns.  It was a pleasure to meet you and see your grandmother today!  Websites that have reliable patient information: 1. American Academy of Asthma, Allergy, and Immunology: www.aaaai.org 2. Food Allergy Research and Education (FARE): foodallergy.org 3. Mothers of Asthmatics: http://www.asthmacommunitynetwork.org 4. American College of Allergy, Asthma, and Immunology: www.acaai.org      "Like" us  on Facebook and Instagram for our latest updates!      A healthy democracy works best when [employer] participate! Make sure you are registered to vote! If you have moved or changed any of your contact information, you will need to get this updated before voting! Scan the QR codes below to learn more!

## 2024-02-26 NOTE — Progress Notes (Unsigned)
 NEW PATIENT  Date of Service/Encounter:  02/26/24  Consult requested by: Pcp, No   Assessment:   No diagnosis found.  Plan/Recommendations:   There are no Patient Instructions on file for this visit.   {Blank single:19197::"This note in its entirety was forwarded to the Provider who requested this consultation."}  Subjective:   Spencer Hall is a 23 y.o. male presenting today for evaluation of  Chief Complaint  Patient presents with   Asthma    Wheezing the night before, woke up with tightness in chest - stopped the inhalers    Allergic Rhinitis     Congestion/ stuffy nose - takes OTC zyrtec  prn     Spencer Hall has a history of the following: Patient Active Problem List   Diagnosis Date Noted   Other insomnia 01/06/2020   Major depressive disorder, recurrent episode, mild (HCC) 07/24/2019   Mild persistent asthma 05/27/2016   Allergic rhinitis    S/P tympanostomy tube placement 06/23/2015   Migraine without aura and without status migrainosus, not intractable 02/15/2015   Episodic tension type headache 04/06/2013    History obtained from: chart review and {Persons; PED relatives w/patient:19415::"patient"}.  Discussed the use of AI scribe software for clinical note transcription with the patient and/or guardian, who gave verbal consent to proceed.  Spencer Hall was referred by Pcp, No.     [name] is a 23 y.o. male presenting for {Blank single:19197::"a food challenge","a drug challenge","skin testing","a sick visit","an evaluation of ***","a follow up visit"}.    Asthma/Respiratory Symptom History: ***  Allergic Rhinitis Symptom History: ***  Food Allergy Symptom History: ***  Skin Symptom History: ***  GERD Symptom History: ***  Infection Symptom History: ***  ***Otherwise, there is no history of other atopic diseases, including {Blank multiple:19196:o:"asthma","food allergies","drug allergies","environmental allergies","stinging insect  allergies","eczema","urticaria","contact dermatitis"}. There is no significant infectious history. ***Vaccinations are up to date.    Past Medical History: Patient Active Problem List   Diagnosis Date Noted   Other insomnia 01/06/2020   Major depressive disorder, recurrent episode, mild (HCC) 07/24/2019   Mild persistent asthma 05/27/2016   Allergic rhinitis    S/P tympanostomy tube placement 06/23/2015   Migraine without aura and without status migrainosus, not intractable 02/15/2015   Episodic tension type headache 04/06/2013    Medication List:  Allergies as of 02/26/2024       Reactions   Lactose Intolerance (gi) Other (See Comments)   Peanut-containing Drug Products Swelling        Medication List        Accurate as of Feb 26, 2024  3:11 PM. If you have any questions, ask your nurse or doctor.          HYDROcodone -acetaminophen  5-325 MG tablet Commonly known as: NORCO/VICODIN Take 1 tablet by mouth every 4 (four) hours as needed for moderate pain (pain score 4-6).   HYDROcodone -acetaminophen  5-325 MG tablet Commonly known as: NORCO/VICODIN Take 1 tablet by mouth every 4 (four) hours as needed for moderate pain (pain score 4-6) or severe pain (pain score 7-10).   naproxen  500 MG tablet Commonly known as: NAPROSYN  Take 1 tablet (500 mg total) by mouth 2 (two) times daily.   ondansetron  4 MG tablet Commonly known as: Zofran  Take 1 tablet (4 mg total) by mouth every 8 (eight) hours as needed for vomiting or nausea.        Birth History: {Blank single:19197::"non-contributory","born premature and spent time in the NICU","born at term without complications"}  Developmental  History: Hervin has met all milestones on time. He has required no {Blank multiple:19196:a:"speech therapy","occupational therapy","physical therapy"}. ***non-contributory  Past Surgical History: Past Surgical History:  Procedure Laterality Date   ADENOIDECTOMY  2004   CIRCUMCISION  2003    ORIF PATELLA Left 11/08/2023   Procedure: KNEE OPEN PATELLA DEBRIDEMENT WITH TIBIAL AND PATELLA OSTECTOMY;  Surgeon: Janeth Medicus, MD;  Location: [facility];  Service: Orthopedics;  Laterality: Left;   TONSILLECTOMY AND ADENOIDECTOMY Bilateral 2005   TYMPANOSTOMY TUBE PLACEMENT Bilateral 01/2011   Performed at [facility]   TYMPANOSTOMY TUBE PLACEMENT Bilateral 2006   Performed at [facility]     Family History: Family History  Problem Relation Age of Onset   Asthma Other    Diabetes Other      Social History: Spencer Hall lives at home with ***.    Review of systems otherwise negative other than that mentioned in the HPI.    Objective:   Blood pressure 134/88, pulse (!) 112, temperature (!) 96.4 F (35.8 C), temperature source Temporal, resp. rate 18, height 5\' 11"  (1.803 m), weight 218 lb (98.9 kg), SpO2 97%. Body mass index is 30.4 kg/m.     Physical Exam   Diagnostic studies:    Spirometry: results normal (FEV1: 3.13/76%, FVC: 4.58/94%, FEV1/FVC: 68%).    Spirometry consistent with mild obstructive disease. {Blank single:19197::"Albuterol /Atrovent  nebulizer","Xopenex/Atrovent  nebulizer","Albuterol  nebulizer","Albuterol  four puffs via MDI","Xopenex four puffs via MDI"} treatment given in clinic with {Blank single:19197::"significant improvement in FEV1 per ATS criteria","significant improvement in FVC per ATS criteria","significant improvement in FEV1 and FVC per ATS criteria","improvement in FEV1, but not significant per ATS criteria","improvement in FVC, but not significant per ATS criteria","improvement in FEV1 and FVC, but not significant per ATS criteria","no improvement"}.  Allergy Studies: {Blank single:19197::"none","deferred due to recent antihistamine use","deferred due to insurance stipulations that require a separate visit for testing","labs sent instead"," "}    {Blank single:19197::"Allergy testing results were read and  interpreted by myself, documented by clinical staff."," "}         Drexel Gentles, MD [facility]  

## 2024-02-27 ENCOUNTER — Encounter: Payer: Self-pay | Admitting: Allergy & Immunology

## 2024-02-27 NOTE — Progress Notes (Signed)
 This encounter was created in error - please disregard.

## 2024-03-19 ENCOUNTER — Emergency Department (HOSPITAL_COMMUNITY)
Admission: EM | Admit: 2024-03-19 | Discharge: 2024-03-20 | Disposition: A | Discharge status: Home / Self Care | Attending: Emergency Medicine | Admitting: Emergency Medicine

## 2024-03-19 ENCOUNTER — Emergency Department (HOSPITAL_COMMUNITY)

## 2024-03-19 ENCOUNTER — Encounter (HOSPITAL_COMMUNITY): Payer: Self-pay

## 2024-03-19 ENCOUNTER — Other Ambulatory Visit: Payer: Self-pay

## 2024-03-19 DIAGNOSIS — J45909 Unspecified asthma, uncomplicated: Secondary | ICD-10-CM | POA: Insufficient documentation

## 2024-03-19 DIAGNOSIS — R0789 Other chest pain: Secondary | ICD-10-CM | POA: Diagnosis present

## 2024-03-19 LAB — CBC
HCT: 46.8 % (ref 39.0–52.0)
Hemoglobin: 15.6 g/dL (ref 13.0–17.0)
MCH: 26.4 pg (ref 26.0–34.0)
MCHC: 33.3 g/dL (ref 30.0–36.0)
MCV: 79.2 fL — ABNORMAL LOW (ref 80.0–100.0)
Platelets: 233 10*3/uL (ref 150–400)
RBC: 5.91 MIL/uL — ABNORMAL HIGH (ref 4.22–5.81)
RDW: 12.8 % (ref 11.5–15.5)
WBC: 6.8 10*3/uL (ref 4.0–10.5)
nRBC: 0 % (ref 0.0–0.2)

## 2024-03-19 LAB — TROPONIN I (HIGH SENSITIVITY)
Troponin I (High Sensitivity): 3 ng/L (ref <18)
Troponin I (High Sensitivity): 4 ng/L (ref <18)

## 2024-03-19 LAB — BASIC METABOLIC PANEL WITH GFR
Anion gap: 12 (ref 5–15)
BUN: 10 mg/dL (ref 6–20)
CO2: 24 mmol/L (ref 22–32)
Calcium: 9.9 mg/dL (ref 8.9–10.3)
Chloride: 103 mmol/L (ref 98–111)
Creatinine, Ser: 1.13 mg/dL (ref 0.61–1.24)
GFR, Estimated: >60 mL/min (ref >60)
Glucose, Bld: 81 mg/dL (ref 70–99)
Potassium: 3.6 mmol/L (ref 3.5–5.1)
Sodium: 139 mmol/L (ref 135–145)

## 2024-03-19 NOTE — ED Triage Notes (Addendum)
 Pt arrived POV from home stating he has had a couple of asthma attacks over the last few days and wanted to be checked out. Pt states his doctor started him on a trellgy inhaler about 2 weeks ago but did not think it was helping the last couple of days. Pt also c/o of centralized CP that started yesterday that has been constant and radiates to his back. Pt c/o SHOB, nausea and dizziness with the pain.

## 2024-03-20 MED ORDER — IPRATROPIUM-ALBUTEROL 0.5-2.5 (3) MG/3ML IN SOLN
3.0000 mL | Freq: Once | RESPIRATORY_TRACT | Status: AC
  Administered 2024-03-20: 3 mL via RESPIRATORY_TRACT
  Filled 2024-03-20: qty 3

## 2024-03-20 MED ORDER — PREDNISONE 20 MG PO TABS: 40.0000 mg | ORAL_TABLET | Freq: Every day | ORAL | 0 refills | Status: AC

## 2024-03-20 NOTE — ED Provider Notes (Signed)
 [facility] Emergency Department Provider Note MRN:  [id number]  Arrival date & time: 03/20/24     Chief Complaint   Chest Pain   History of Present Illness   Spencer Hall is a 23 y.o. year-old male with a history of asthma presenting to the ED with chief complaint of chest pain.  Chest tightness that feels similar to prior asthma flareups.  Associated with shortness of breath.  Feels a lot better since coming to the emergency department.  Denies leg pain or swelling, no history of blood clots, no history of heart problems.  Review of Systems  A thorough review of systems was obtained and all systems are negative except as noted in the HPI and PMH.   Patient's Health History    Past Medical History:  Diagnosis Date   Allergy    Anxiety    Phreesia 06/02/2020   Asthma    Phreesia 06/02/2020   Depression    Phreesia 06/02/2020   Headache(784.0)    Lactose intolerance    Mild persistent asthma    Radius fracture 03/08/2011    Past Surgical History:  Procedure Laterality Date   ADENOIDECTOMY  2004   CIRCUMCISION  2003   ORIF PATELLA Left 11/08/2023   Procedure: KNEE OPEN PATELLA DEBRIDEMENT WITH TIBIAL AND PATELLA OSTECTOMY;  Surgeon: Spencer Medicus, MD;  Location: [facility];  Service: Orthopedics;  Laterality: Left;   TONSILLECTOMY AND ADENOIDECTOMY Bilateral 2005   TYMPANOSTOMY TUBE PLACEMENT Bilateral 01/2011   Performed at [facility]   TYMPANOSTOMY TUBE PLACEMENT Bilateral 2006   Performed at [facility]    Family History  Problem Relation Age of Onset   Asthma Other    Diabetes Other     Social History   Socioeconomic History   Marital status: Single    Spouse name: Not on file   Number of children: Not on file   Years of education: Not on file   Highest education level: Not on file  Occupational History   Not on file  Tobacco Use   Smoking status: Never   Smokeless tobacco: Never  Vaping  Use   Vaping status: Former  Substance and Sexual Activity   Alcohol use: No   Drug use: No   Sexual activity: Never  Other Topics Concern   Not on file  Social History Narrative   Spencer Hall is a 9th grade student.   He attends [employer].   He lives with his parents and brother.    He enjoys football, basketball, and drawing.    Social Drivers of Corporate investment banker Strain: Not on file  Food Insecurity: Not on file  Transportation Needs: Not on file  Physical Activity: Not on file  Stress: Not on file  Social Connections: Not on file  Intimate Partner Violence: Low Risk  (03/24/2021)   Received from [facility]   Intimate Partner Violence    Insults You: Not on file    Threatens You: Not on file    Screams at You: Not on file    Physically Hurt: Not on file    Intimate Partner Violence Score: Not on file     Physical Exam   Vitals:   03/19/24 2226 03/20/24 0301  BP: 136/78 136/85  Pulse: 66 74  Resp: 16 16  Temp: 98 F (36.7 C) 98.4 F (36.9 C)  SpO2: 100% 100%    CONSTITUTIONAL: Well-appearing, NAD NEURO/PSYCH:  Alert and oriented  x 3, no focal deficits EYES:  eyes equal and reactive ENT/NECK:  no LAD, no JVD CARDIO: Regular rate, well-perfused, normal S1 and S2 PULM:  CTAB no wheezing or rhonchi GI/GU:  non-distended, non-tender MSK/SPINE:  No gross deformities, no edema SKIN:  no rash, atraumatic   *Additional and/or pertinent findings included in MDM below  Diagnostic and Interventional Summary    EKG Interpretation Date/Time:  Friday Mar 20 2024 03:46:27 EDT Ventricular Rate:  70 PR Interval:  128 QRS Duration:  122 QT Interval:  388 QTC Calculation: 419 R Axis:   41  Text Interpretation: Normal sinus rhythm with sinus arrhythmia Right bundle branch block T wave abnormality, consider lateral ischemia Abnormal ECG When compared with ECG of 16-Mar-2019 14:30, PREVIOUS ECG IS PRESENT Confirmed by Spencer Hall [phone]) on 03/20/2024  4:03:53 AM       Labs Reviewed  CBC - Abnormal; Notable for the following components:      Result Value   RBC 5.91 (*)    MCV 79.2 (*)    All other components within normal limits  BASIC METABOLIC PANEL WITH GFR  TROPONIN I (HIGH SENSITIVITY)  TROPONIN I (HIGH SENSITIVITY)    DG Chest 2 View  Final Result      Medications  ipratropium-albuterol  (DUONEB) 0.5-2.5 (3) MG/3ML nebulizer solution 3 mL (3 mLs Nebulization Given 03/20/24 0323)     Procedures  /  Critical Care Procedures  ED Course and Medical Decision Making  Initial Impression and Ddx Seems consistent with mild asthma flareup.  Seems to be much recovered since the symptoms started.  Lungs are clear, occasional wheeze, no increased work of breathing.  Doubt cardiopulmonary emergency, nothing to suggest PE.  EKG without concerning changes.  Past medical/surgical history that increases complexity of ED encounter: Asthma  Interpretation of Diagnostics I personally reviewed the Chest Xray and my interpretation is as follows: No lobar opacity or pneumothorax  No significant blood count or electrolyte disturbance.  Troponin negative  Patient Reassessment and Ultimate Disposition/Management     Discharge  Patient management required discussion with the following services or consulting groups:  None  Complexity of Problems Addressed Acute complicated illness or Injury  Additional Data Reviewed and Analyzed Further history obtained from: Further history from spouse/family member  Additional Factors Impacting ED Encounter Risk Prescriptions  Spencer Abe. Harless Lien, MD [facility] Emergency Medicine [facility] [email or web address]  Final Clinical Impressions(s) / ED Diagnoses     ICD-10-CM   1. Mild asthma without complication, unspecified whether persistent  J45.909       ED Discharge Orders          Ordered    predniSONE  (DELTASONE ) 20 MG tablet  Daily        03/20/24 0404              Discharge Instructions Discussed with and Provided to Patient:     Discharge Instructions      You were evaluated in the Emergency Department and after careful evaluation, we did not find any emergent condition requiring admission or further testing in the hospital.  Your exam/testing today is overall reassuring.  Symptoms likely due to a flare of your asthma.  Can continue your home inhalers, use the steroid medication as prescribed, follow-up with your primary care doctor.  Please return to the Emergency Department if you experience any worsening of your condition.   Thank you for allowing us  to be a part of your care.  Edson Graces, MD 03/20/24 [phone]

## 2024-03-20 NOTE — Discharge Instructions (Signed)
 You were evaluated in the Emergency Department and after careful evaluation, we did not find any emergent condition requiring admission or further testing in the hospital.  Your exam/testing today is overall reassuring.  Symptoms likely due to a flare of your asthma.  Can continue your home inhalers, use the steroid medication as prescribed, follow-up with your primary care doctor.  Please return to the Emergency Department if you experience any worsening of your condition.   Thank you for allowing us  to be a part of your care.

## 2024-04-08 ENCOUNTER — Ambulatory Visit: Admission: EM | Admit: 2024-04-08 | Discharge: 2024-04-08 | Disposition: A | Discharge status: Home / Self Care

## 2024-04-08 DIAGNOSIS — J069 Acute upper respiratory infection, unspecified: Secondary | ICD-10-CM | POA: Diagnosis not present

## 2024-04-08 LAB — POC SARS CORONAVIRUS 2 AG -  ED: SARS Coronavirus 2 Ag: NEGATIVE

## 2024-04-08 MED ORDER — FLUTICASONE PROPIONATE 50 MCG/ACT NA SUSP: 1.0000 | Freq: Two times a day (BID) | NASAL | 2 refills | Status: AC

## 2024-04-08 MED ORDER — PROMETHAZINE-DM 6.25-15 MG/5ML PO SYRP: 5.0000 mL | ORAL_SOLUTION | Freq: Four times a day (QID) | ORAL | 0 refills | Status: DC | PRN

## 2024-04-08 NOTE — ED Triage Notes (Signed)
 Pt reports sore throat, difficulty swallowing, lack of sleep, fatigued, sweats and chills, cough, congestion x 4 days

## 2024-04-09 NOTE — ED Provider Notes (Signed)
 [facility]    CSN: [id number] Arrival date & time: 04/08/24  0802      History   Chief Complaint No chief complaint on file.   HPI Spencer Hall is a 23 y.o. male.   Patient presenting today with 4-day history of sore throat, fatigue, sweats, chills, cough, congestion.  Denies chest pain, shortness of breath, abdominal pain, vomiting, diarrhea.  So far not trying anything over-the-counter for symptoms.  No known sick contacts recently.  History of seasonal allergies and asthma on as needed regimen for both.    Past Medical History:  Diagnosis Date   Allergy    Anxiety    Phreesia 06/02/2020   Asthma    Phreesia 06/02/2020   Depression    Phreesia 06/02/2020   Headache(784.0)    Lactose intolerance    Mild persistent asthma    Radius fracture 03/08/2011    Patient Active Problem List   Diagnosis Date Noted   Other insomnia 01/06/2020   Major depressive disorder, recurrent episode, mild (HCC) 07/24/2019   Mild persistent asthma 05/27/2016   Allergic rhinitis    S/P tympanostomy tube placement 06/23/2015   Migraine without aura and without status migrainosus, not intractable 02/15/2015   Episodic tension type headache 04/06/2013    Past Surgical History:  Procedure Laterality Date   ADENOIDECTOMY  2004   CIRCUMCISION  2003   ORIF PATELLA Left 11/08/2023   Procedure: KNEE OPEN PATELLA DEBRIDEMENT WITH TIBIAL AND PATELLA OSTECTOMY;  Surgeon: Janeth Medicus, MD;  Location: [facility];  Service: Orthopedics;  Laterality: Left;   TONSILLECTOMY AND ADENOIDECTOMY Bilateral 2005   TYMPANOSTOMY TUBE PLACEMENT Bilateral 01/2011   Performed at [facility]   TYMPANOSTOMY TUBE PLACEMENT Bilateral 2006   Performed at [facility] Medications    Prior to Admission medications   Medication Sig Start Date End Date Taking? Authorizing Provider  albuterol  (VENTOLIN  HFA) 108 (90 Base) MCG/ACT inhaler Inhale 2 puffs  into the lungs. 10/15/11  Yes [provider]  fluticasone  (FLONASE ) 50 MCG/ACT nasal spray Place 1 spray into both nostrils 2 (two) times daily. 04/08/24  Yes Corbin Dess, PA-C  promethazine-dextromethorphan (PROMETHAZINE-DM) 6.25-15 MG/5ML syrup Take 5 mLs by mouth 4 (four) times daily as needed. 04/08/24  Yes Corbin Dess, PA-C  ondansetron  (ZOFRAN ) 4 MG tablet Take 1 tablet (4 mg total) by mouth every 8 (eight) hours as needed for vomiting or nausea. 11/08/23   Janeth Medicus, MD    Family History Family History  Problem Relation Age of Onset   Asthma Other    Diabetes Other     Social History Social History   Tobacco Use   Smoking status: Never   Smokeless tobacco: Never  Vaping Use   Vaping status: Former  Substance Use Topics   Alcohol use: No   Drug use: No     Allergies   Lactose intolerance (gi) and Peanut-containing drug products   Review of Systems Review of Systems PER HPI  Physical Exam Triage Vital Signs ED Triage Vitals  Encounter Vitals Group     BP 04/08/24 0819 125/78     Girls Systolic BP Percentile --      Girls Diastolic BP Percentile --      Boys Systolic BP Percentile --      Boys Diastolic BP Percentile --      Pulse Rate 04/08/24 0819 78     Resp 04/08/24  0819 16     Temp 04/08/24 0819 98 F (36.7 C)     Temp Source 04/08/24 0819 Oral     SpO2 04/08/24 0819 96 %     Weight --      Height --      Head Circumference --      Peak Flow --      Pain Score 04/08/24 0823 0     Pain Loc --      Pain Education --      Exclude from Growth Chart --    No data found.  Updated Vital Signs BP 125/78 (BP Location: Right Arm)   Pulse 78   Temp 98 F (36.7 C) (Oral)   Resp 16   SpO2 96%   Visual Acuity Right Eye Distance:   Left Eye Distance:   Bilateral Distance:    Right Eye Near:   Left Eye Near:    Bilateral Near:     Physical Exam Vitals and nursing note reviewed.  Constitutional:       Appearance: He is well-developed.  HENT:     Head: Atraumatic.     Right Ear: External ear normal.     Left Ear: External ear normal.     Nose: Rhinorrhea present.     Mouth/Throat:     Pharynx: Posterior oropharyngeal erythema present. No oropharyngeal exudate.   Eyes:     Conjunctiva/sclera: Conjunctivae normal.     Pupils: Pupils are equal, round, and reactive to light.    Cardiovascular:     Rate and Rhythm: Normal rate and regular rhythm.  Pulmonary:     Effort: Pulmonary effort is normal. No respiratory distress.     Breath sounds: No wheezing or rales.   Musculoskeletal:        General: Normal range of motion.     Cervical back: Normal range of motion and neck supple.  Lymphadenopathy:     Cervical: No cervical adenopathy.   Skin:    General: Skin is warm and dry.   Neurological:     Mental Status: He is alert and oriented to person, place, and time.   Psychiatric:        Behavior: Behavior normal.      UC Treatments / Results  Labs (all labs ordered are listed, but only abnormal results are displayed) Labs Reviewed  POC SARS CORONAVIRUS 2 AG -  ED    EKG   Radiology No results found.  Procedures Procedures (including critical care time)  Medications Ordered in UC Medications - No data to display  Initial Impression / Assessment and Plan / UC Course  I have reviewed the triage vital signs and the nursing notes.  Pertinent labs & imaging results that were available during my care of the patient were reviewed by me and considered in my medical decision making (see chart for details).     Vital signs and exam very reassuring today and suspicious for viral respiratory infection.  Rapid strep negative.  Will treat with Phenergan DM, Flonase , supportive over-the-counter medications and home care.  Work note given.  Return for worsening symptoms.  Final Clinical Impressions(s) / UC Diagnoses   Final diagnoses:  Viral URI with cough   Discharge  Instructions   None    ED Prescriptions     Medication Sig Dispense Auth. Provider   promethazine-dextromethorphan (PROMETHAZINE-DM) 6.25-15 MG/5ML syrup Take 5 mLs by mouth 4 (four) times daily as needed. 100 mL Corbin Dess, PA-C  fluticasone  (FLONASE ) 50 MCG/ACT nasal spray Place 1 spray into both nostrils 2 (two) times daily. 16 g Corbin Dess, [place]      PDMP not reviewed this encounter.   Corbin Dess, [place] 04/09/24 1551

## 2024-04-22 ENCOUNTER — Encounter: Payer: Self-pay | Admitting: Emergency Medicine

## 2024-04-22 ENCOUNTER — Ambulatory Visit
Admission: EM | Admit: 2024-04-22 | Discharge: 2024-04-22 | Disposition: A | Discharge status: Home / Self Care | Attending: Family Medicine | Admitting: Family Medicine

## 2024-04-22 DIAGNOSIS — W540XXA Bitten by dog, initial encounter: Secondary | ICD-10-CM | POA: Diagnosis not present

## 2024-04-22 DIAGNOSIS — Z23 Encounter for immunization: Secondary | ICD-10-CM | POA: Diagnosis not present

## 2024-04-22 DIAGNOSIS — S0181XA Laceration without foreign body of other part of head, initial encounter: Secondary | ICD-10-CM

## 2024-04-22 MED ORDER — CHLORHEXIDINE GLUCONATE 4 % EX SOLN: Freq: Every day | CUTANEOUS | 0 refills | Status: AC | PRN

## 2024-04-22 MED ORDER — AMOXICILLIN-POT CLAVULANATE 875-125 MG PO TABS: 1.0000 | ORAL_TABLET | Freq: Two times a day (BID) | ORAL | 0 refills | Status: DC

## 2024-04-22 MED ORDER — MUPIROCIN 2 % EX OINT: 1.0000 | TOPICAL_OINTMENT | Freq: Two times a day (BID) | CUTANEOUS | 0 refills | Status: AC

## 2024-04-22 MED ORDER — TETANUS-DIPHTH-ACELL PERTUSSIS 5-2.5-18.5 LF-MCG/0.5 IM SUSY
0.5000 mL | PREFILLED_SYRINGE | Freq: Once | INTRAMUSCULAR | Status: AC
Start: 2024-04-22 — End: 2024-04-22
  Administered 2024-04-22: 0.5 mL via INTRAMUSCULAR

## 2024-04-22 NOTE — ED Provider Notes (Signed)
 [facility]    CSN: [id number] Arrival date & time: 04/22/24  1841      History   Chief Complaint No chief complaint on file.   HPI Spencer Hall is a 23 y.o. male.   Patient presenting today with a laceration to the underside of the chin that occurred about 30 minutes prior to arrival when his small dog got excited and accidentally bit his chin.  Denies significant pain, uncontrolled bleeding, loss of range of motion to the jaw, numbness, tingling.  So far not trying anything over-the-counter for symptoms.    Past Medical History:  Diagnosis Date   Allergy    Anxiety    Phreesia 06/02/2020   Asthma    Phreesia 06/02/2020   Depression    Phreesia 06/02/2020   Headache(784.0)    Lactose intolerance    Mild persistent asthma    Radius fracture 03/08/2011    Patient Active Problem List   Diagnosis Date Noted   Other insomnia 01/06/2020   Major depressive disorder, recurrent episode, mild (HCC) 07/24/2019   Mild persistent asthma 05/27/2016   Allergic rhinitis    S/P tympanostomy tube placement 06/23/2015   Migraine without aura and without status migrainosus, not intractable 02/15/2015   Episodic tension type headache 04/06/2013    Past Surgical History:  Procedure Laterality Date   ADENOIDECTOMY  2004   CIRCUMCISION  2003   ORIF PATELLA Left 11/08/2023   Procedure: KNEE OPEN PATELLA DEBRIDEMENT WITH TIBIAL AND PATELLA OSTECTOMY;  Surgeon: Sharl Selinda Dover, MD;  Location: [facility];  Service: Orthopedics;  Laterality: Left;   TONSILLECTOMY AND ADENOIDECTOMY Bilateral 2005   TYMPANOSTOMY TUBE PLACEMENT Bilateral 01/2011   Performed at [facility]   TYMPANOSTOMY TUBE PLACEMENT Bilateral 2006   Performed at [facility] Medications    Prior to Admission medications   Medication Sig Start Date End Date Taking? Authorizing Provider  amoxicillin-clavulanate (AUGMENTIN) 875-125 MG tablet Take 1 tablet by  mouth every 12 (twelve) hours. 04/22/24  Yes Stuart Vernell Norris, PA-C  chlorhexidine (HIBICLENS) 4 % external liquid Apply topically daily as needed. 04/22/24  Yes Stuart Vernell Norris, PA-C  mupirocin ointment (BACTROBAN) 2 % Apply 1 Application topically 2 (two) times daily. 04/22/24  Yes Stuart Vernell Norris, PA-C  albuterol  (VENTOLIN  HFA) [phone] Base) MCG/ACT inhaler Inhale 2 puffs into the lungs. 10/15/11   [provider]  fluticasone  (FLONASE ) 50 MCG/ACT nasal spray Place 1 spray into both nostrils 2 (two) times daily. 04/08/24   Stuart Vernell Norris, PA-C  ondansetron  (ZOFRAN ) 4 MG tablet Take 1 tablet (4 mg total) by mouth every 8 (eight) hours as needed for vomiting or nausea. 11/08/23   Sharl Selinda Dover, MD    Family History Family History  Problem Relation Age of Onset   Asthma Other    Diabetes Other     Social History Social History   Tobacco Use   Smoking status: Never   Smokeless tobacco: Never  Vaping Use   Vaping status: Former  Substance Use Topics   Alcohol use: No   Drug use: No     Allergies   Lactose intolerance (gi) and Peanut-containing drug products   Review of Systems Review of Systems Per HPI  Physical Exam Triage Vital Signs ED Triage Vitals  Encounter Vitals Group     BP 04/22/24 1845 (!) 150/81     Girls Systolic BP Percentile --  Girls Diastolic BP Percentile --      Boys Systolic BP Percentile --      Boys Diastolic BP Percentile --      Pulse Rate 04/22/24 1845 64     Resp 04/22/24 1845 18     Temp 04/22/24 1845 98.7 F (37.1 C)     Temp Source 04/22/24 1845 Oral     SpO2 04/22/24 1845 97 %     Weight --      Height --      Head Circumference --      Peak Flow --      Pain Score 04/22/24 1847 0     Pain Loc --      Pain Education --      Exclude from Growth Chart --    No data found.  Updated Vital Signs BP (!) 150/81 (BP Location: Right Arm)   Pulse 64   Temp 98.7 F (37.1 C) (Oral)   Resp 18    SpO2 97%   Visual Acuity Right Eye Distance:   Left Eye Distance:   Bilateral Distance:    Right Eye Near:   Left Eye Near:    Bilateral Near:     Physical Exam Vitals and nursing note reviewed.  Constitutional:      Appearance: Normal appearance.  HENT:     Head: Atraumatic.  Eyes:     Extraocular Movements: Extraocular movements intact.     Conjunctiva/sclera: Conjunctivae normal.  Cardiovascular:     Rate and Rhythm: Normal rate.  Pulmonary:     Effort: Pulmonary effort is normal.  Musculoskeletal:        General: Normal range of motion.     Cervical back: Normal range of motion and neck supple.  Skin:    General: Skin is warm.     Comments: 0.5 cm superficial laceration to the underside of the chin toward the left.  Bleeding well-controlled, no foreign body appreciable  Neurological:     General: No focal deficit present.     Mental Status: He is oriented to person, place, and time.  Psychiatric:        Mood and Affect: Mood normal.        Thought Content: Thought content normal.        Judgment: Judgment normal.      UC Treatments / Results  Labs (all labs ordered are listed, but only abnormal results are displayed) Labs Reviewed - No data to display  EKG   Radiology No results found.  Procedures Procedures (including critical care time)  Medications Ordered in UC Medications  Tdap (BOOSTRIX) injection 0.5 mL (0.5 mLs Intramuscular Given 04/22/24 1900)    Initial Impression / Assessment and Plan / UC Course  I have reviewed the triage vital signs and the nursing notes.  Pertinent labs & imaging results that were available during my care of the patient were reviewed by me and considered in my medical decision making (see chart for details).     Tdap updated, wound cleaned and will place on Augmentin, Hibiclens, Bactroban and discussed home wound care and return precautions.  He states his dog is up-to-date on rabies vaccine.  Return for worsening  symptoms.  Final Clinical Impressions(s) / UC Diagnoses   Final diagnoses:  Chin laceration, initial encounter  Dog bite, initial encounter   Discharge Instructions   None    ED Prescriptions     Medication Sig Dispense Auth. Provider   amoxicillin-clavulanate (AUGMENTIN) 875-125 MG  tablet Take 1 tablet by mouth every 12 (twelve) hours. 14 tablet Stuart Vernell Norris, PA-C   chlorhexidine (HIBICLENS) 4 % external liquid Apply topically daily as needed. 236 mL Stuart Vernell Norris, PA-C   mupirocin ointment (BACTROBAN) 2 % Apply 1 Application topically 2 (two) times daily. 22 g Stuart Vernell Norris, [place]      PDMP not reviewed this encounter.   Stuart Vernell Norris, [place] 04/22/24 1919

## 2024-04-22 NOTE — ED Triage Notes (Signed)
 Dog bite to chin area that happened today.  Dog is up to date on shots

## 2024-04-27 ENCOUNTER — Encounter: Payer: Self-pay | Admitting: Allergy & Immunology

## 2024-04-27 ENCOUNTER — Ambulatory Visit: Admitting: Allergy & Immunology

## 2024-04-27 VITALS — BP 152/88 | HR 78 | Temp 97.9°F | Wt 214.0 lb

## 2024-04-27 DIAGNOSIS — T7805XD Anaphylactic reaction due to tree nuts and seeds, subsequent encounter: Secondary | ICD-10-CM

## 2024-04-27 DIAGNOSIS — J4530 Mild persistent asthma, uncomplicated: Secondary | ICD-10-CM | POA: Diagnosis not present

## 2024-04-27 DIAGNOSIS — J3089 Other allergic rhinitis: Secondary | ICD-10-CM

## 2024-04-27 DIAGNOSIS — T7801XD Anaphylactic reaction due to peanuts, subsequent encounter: Secondary | ICD-10-CM

## 2024-04-27 DIAGNOSIS — J302 Other seasonal allergic rhinitis: Secondary | ICD-10-CM

## 2024-04-27 MED ORDER — TRELEGY ELLIPTA 200-62.5-25 MCG/ACT IN AEPB: 1.0000 | INHALATION_SPRAY | Freq: Every day | RESPIRATORY_TRACT | 5 refills | Status: AC

## 2024-04-27 MED ORDER — LEVOCETIRIZINE DIHYDROCHLORIDE 5 MG PO TABS: 5.0000 mg | ORAL_TABLET | Freq: Every evening | ORAL | 1 refills | Status: AC

## 2024-04-27 MED ORDER — NEFFY 2 MG/0.1ML NA SOLN: 2.0000 mg | NASAL | 1 refills | Status: AC | PRN

## 2024-04-27 MED ORDER — ALBUTEROL SULFATE HFA 108 (90 BASE) MCG/ACT IN AERS: 2.0000 | INHALATION_SPRAY | RESPIRATORY_TRACT | 1 refills | Status: AC | PRN

## 2024-04-27 NOTE — Patient Instructions (Addendum)
 1. Mild persistent asthma, uncomplicated - Lung testing looked great today. - We sent in the Trelegy once again.  - Daily controller medication(s): Trelegy 200 mcg one puff once daily - Prior to physical activity: albuterol  2 puffs 10-15 minutes before physical activity. - Rescue medications: albuterol  4 puffs every 4-6 hours as needed - Asthma control goals:  * Full participation in all desired activities (may need albuterol  before activity) * Albuterol  use two time or less a week on average (not counting use with activity) * Cough interfering with sleep two time or less a month * Oral steroids no more than once a year * No hospitalizations  2. Seasonal and perennial allergic rhinitis - We are going to get some blood work to look for environmental allergies. - We will call you in 1-2 weeks with the results of the testing.  - Continue with Flonase  one spray per nostril twice daily. - Continue with levocetirizine 5 mg up to twice daily. - We can consider allergy shots again in the future if needed.   3. Peanut and tree nut-induced anaphylaxis - We are rechecking peanut and tree nut allergy levels. - We are going to send in a nasal epinephrine . - We will call you in 1-2 weeks with the results of the testing.   4. Return in about 3 months (around 07/28/2024). You can have the follow up appointment with Dr. Iva or a Nurse Practicioner (our Nurse Practitioners are excellent and always have Physician oversight!).    Please inform us  of any Emergency Department visits, hospitalizations, or changes in symptoms. Call us  before going to the ED for breathing or allergy symptoms since we might be able to fit you in for a sick visit. Feel free to contact us  anytime with any questions, problems, or concerns.  It was a pleasure to meet you and see your grandmother today!  Websites that have reliable patient information: 1. American Academy of Asthma, Allergy, and Immunology: www.aaaai.org 2.  Food Allergy Research and Education (FARE): foodallergy.org 3. Mothers of Asthmatics: http://www.asthmacommunitynetwork.org 4. American College of Allergy, Asthma, and Immunology: www.acaai.org      "Like" us  on Facebook and Instagram for our latest updates!      A healthy democracy works best when [employer] participate! Make sure you are registered to vote! If you have moved or changed any of your contact information, you will need to get this updated before voting! Scan the QR codes below to learn more!

## 2024-04-27 NOTE — Progress Notes (Signed)
 FOLLOW UP  Date of Service/Encounter:  04/27/24   Assessment:   Mild persistent asthma, uncomplicated    Seasonal and perennial allergic rhinitis   Peanut-induced anaphylaxis   Anaphylaxis due to tree nut    Plan/Recommendations:   1. Mild persistent asthma, uncomplicated - Lung testing looked great today. - We sent in the Trelegy once again.  - Daily controller medication(s): Trelegy 200 mcg one puff once daily - Prior to physical activity: albuterol  2 puffs 10-15 minutes before physical activity. - Rescue medications: albuterol  4 puffs every 4-6 hours as needed - Asthma control goals:  * Full participation in all desired activities (may need albuterol  before activity) * Albuterol  use two time or less a week on average (not counting use with activity) * Cough interfering with sleep two time or less a month * Oral steroids no more than once a year * No hospitalizations  2. Seasonal and perennial allergic rhinitis - We are going to get some blood work to look for environmental allergies. - We will call you in 1-2 weeks with the results of the testing.  - Continue with Flonase  one spray per nostril twice daily. - Continue with levocetirizine 5 mg up to twice daily. - We can consider allergy shots again in the future if needed.   3. Peanut and tree nut-induced anaphylaxis - We are rechecking peanut and tree nut allergy levels. - We are going to send in a nasal epinephrine . - We will call you in 1-2 weeks with the results of the testing.   4. Return in about 3 months (around 07/28/2024). You can have the follow up appointment with Dr. Iva or a Nurse Practicioner (our Nurse Practitioners are excellent and always have Physician oversight!).      Subjective:   Spencer Hall is a 23 y.o. male presenting today for follow up of  Chief Complaint  Patient presents with   Follow-up    Spencer Hall has a history of the following: Patient Active Problem List    Diagnosis Date Noted   Other insomnia 01/06/2020   Major depressive disorder, recurrent episode, mild (HCC) 07/24/2019   Mild persistent asthma 05/27/2016   Allergic rhinitis    S/P tympanostomy tube placement 06/23/2015   Migraine without aura and without status migrainosus, not intractable 02/15/2015   Episodic tension type headache 04/06/2013    History obtained from: chart review and patient and his mother.   Discussed the use of AI scribe software for clinical note transcription with the patient and/or guardian, who gave verbal consent to proceed.  Spencer Hall is a 23 y.o. male presenting for a follow up visit.  He was last seen in May 2025 to reestablish care.  Lung testing looked a bit low, but did improve with albuterol  treatment.  We started him on Trelegy 1 puff once daily for 1 to 2 weeks during flares to see if this helps.  For his rhinitis, we obtain blood work to look for environmental allergies.  We continue with Flonase .  We started Xyzal  in place of Zyrtec .  Allergy shots were discussed for long-term control.  We also rechecked peanut and tree nut allergy levels.  None of these labs was ever collected.  Since last visit, he has been stable.  Asthma/Respiratory Symptom History: He experienced a significant asthma flare-up starting last Wednesday, which worsened by Thursday, prompting him to seek treatment. Initially, his oxygen  saturation was at 92%. He received a breathing treatment and a course of steroids, specifically  40 mg for several days, which helped manage the symptoms. He was not able to get Trelegy from the pharmacy, despite multiple attempts to obtain it. He did receive a sample and used it, noting that he liked it despite the taste.  Allergic Rhinitis Symptom History: He has a history of allergies, with recent exposure in [place] causing some symptoms. He has been using Xyzal  for allergy management, but also faced issues obtaining this medication from the  pharmacy.  Food Allergy Symptom History: He recalls having an allergic reaction last year after consuming nuts, which he managed with Benadryl . No recent skin issues.  He did not get any testing done because he was sick when he forgot about it.  Otherwise, there have been no changes to his past medical history, surgical history, family history, or social history.    Review of systems otherwise negative other than that mentioned in the HPI.    Objective:   Blood pressure (!) 152/88, pulse 78, temperature 97.9 F (36.6 C), weight 214 lb (97.1 kg), SpO2 98%. Body mass index is 29.85 kg/m.    Physical Exam Vitals reviewed.  Constitutional:      Appearance: He is well-developed.     Comments: Well mannered.  Friendly.  HENT:     Head: Normocephalic and atraumatic.     Right Ear: Tympanic membrane, ear canal and external ear normal. No drainage, swelling or tenderness. Tympanic membrane is not injected, scarred, erythematous, retracted or bulging.     Left Ear: Tympanic membrane, ear canal and external ear normal. No drainage, swelling or tenderness. Tympanic membrane is not injected, scarred, erythematous, retracted or bulging.     Nose: No nasal deformity, septal deviation, mucosal edema or rhinorrhea.     Right Turbinates: Enlarged, swollen and pale.     Left Turbinates: Enlarged, swollen and pale.     Right Sinus: No maxillary sinus tenderness or frontal sinus tenderness.     Left Sinus: No maxillary sinus tenderness or frontal sinus tenderness.     Comments: No polyps.     Mouth/Throat:     Mouth: Mucous membranes are not pale and not dry.     Pharynx: Uvula midline.     Comments: Cobblestoning. Eyes:     General: Lids are normal. Allergic shiner present.        Right eye: No discharge.        Left eye: No discharge.     Conjunctiva/sclera: Conjunctivae normal.     Right eye: Right conjunctiva is not injected. No chemosis.    Left eye: Left conjunctiva is not injected. No  chemosis.    Pupils: Pupils are equal, round, and reactive to light.  Cardiovascular:     Rate and Rhythm: Normal rate and regular rhythm.     Heart sounds: Normal heart sounds.  Pulmonary:     Effort: Pulmonary effort is normal. No tachypnea, accessory muscle usage or respiratory distress.     Breath sounds: Normal breath sounds. No wheezing, rhonchi or rales.     Comments: Moving air well in all lung fields.  No increased work of breathing. Chest:     Chest wall: No tenderness.  Lymphadenopathy:     Head:     Right side of head: No submandibular, tonsillar or occipital adenopathy.     Left side of head: No submandibular, tonsillar or occipital adenopathy.     Cervical: No cervical adenopathy.  Skin:    General: Skin is warm.     Capillary  Refill: Capillary refill takes less than 2 seconds.     Coloration: Skin is not pale.     Findings: No abrasion, erythema, petechiae or rash. Rash is not papular, urticarial or vesicular.  Neurological:     Mental Status: He is alert.  Psychiatric:        Behavior: Behavior is cooperative.      Diagnostic studies:    Spirometry: results normal (FEV1: 3.55/86%, FVC: 4.14/85%, FEV1/FVC: 86%).    Spirometry consistent with normal pattern.   Allergy Studies: none       Marty Shaggy, MD  [facility] 

## 2024-05-24 ENCOUNTER — Emergency Department (HOSPITAL_COMMUNITY)
Admission: EM | Admit: 2024-05-24 | Discharge: 2024-05-24 | Disposition: A | Discharge status: Home / Self Care | Attending: Emergency Medicine | Admitting: Emergency Medicine

## 2024-05-24 ENCOUNTER — Emergency Department (HOSPITAL_COMMUNITY)

## 2024-05-24 ENCOUNTER — Encounter (HOSPITAL_COMMUNITY): Payer: Self-pay

## 2024-05-24 ENCOUNTER — Other Ambulatory Visit: Payer: Self-pay

## 2024-05-24 DIAGNOSIS — X509XXA Other and unspecified overexertion or strenuous movements or postures, initial encounter: Secondary | ICD-10-CM | POA: Insufficient documentation

## 2024-05-24 DIAGNOSIS — Y9367 Activity, basketball: Secondary | ICD-10-CM | POA: Diagnosis not present

## 2024-05-24 DIAGNOSIS — G8911 Acute pain due to trauma: Secondary | ICD-10-CM | POA: Insufficient documentation

## 2024-05-24 DIAGNOSIS — Z91010 Allergy to peanuts: Secondary | ICD-10-CM | POA: Diagnosis not present

## 2024-05-24 DIAGNOSIS — M25562 Pain in left knee: Secondary | ICD-10-CM | POA: Insufficient documentation

## 2024-05-24 MED ORDER — MELOXICAM 15 MG PO TBDP: 15.0000 mg | ORAL_TABLET | Freq: Every day | ORAL | 0 refills | Status: AC

## 2024-05-24 MED ORDER — KETOROLAC TROMETHAMINE 60 MG/2ML IM SOLN
60.0000 mg | Freq: Once | INTRAMUSCULAR | Status: AC
  Administered 2024-05-24: 60 mg via INTRAMUSCULAR
  Filled 2024-05-24: qty 2

## 2024-05-24 NOTE — Discharge Instructions (Addendum)
 Please follow-up with Dr. Sharl, thankfully your x-ray shows no broken bones, you can take Mobic  for pain  Please take Mobic ,  once daily as needed for pain - this in an antiinflammatory medicine (NSAID) and is similar to ibuprofen  - many people feel that it is stronger than ibuprofen  and it is easier to take since it is a smaller pill.  Please use this only for 1 week - if your pain persists, you will need to follow up with your doctor in the office for ongoing guidance and pain control.  RICE therapy:  Apply ice wrapped in a towel intermittently keeping it on the skin no longer than 10 minutes a couple of times an hour  Elevate the affected extremity to help reduce blood flow and prevent swelling  Use an anti-inflammatory if you are not allergic to it such as ibuprofen  or Naprosyn  to help with pain and swelling  Use a compressive device whether it is an Ace wrap or other  immobilizer to help minimize movement and compress the swelling.

## 2024-05-24 NOTE — ED Provider Notes (Signed)
  EMERGENCY DEPARTMENT AT [facility] Provider Note   CSN: [id number] Arrival date & time: 05/24/24  2106     Patient presents with: Knee Injury   Spencer Hall is a 23 y.o. male.   HPI   This patient is a very pleasant 23 year old male who presents after having a knee injury while he was playing basketball, he felt a pop, he is having trouble putting any weight on it, he has had prior knee surgery as recently as January on that same side.  Prior to Admission medications   Medication Sig Start Date End Date Taking? Authorizing Provider  Meloxicam  15 MG TBDP Take 15 mg by mouth daily for 14 days. 05/24/24 06/07/24 Yes Cleotilde Rogue, MD  albuterol  (VENTOLIN  HFA) 108 [phone] Base) MCG/ACT inhaler Inhale 2 puffs into the lungs every 4 (four) hours as needed for wheezing or shortness of breath. 04/27/24   Iva Marty Saltness, MD  chlorhexidine  (HIBICLENS ) 4 % external liquid Apply topically daily as needed. 04/22/24   Stuart Vernell Norris, PA-C  EPINEPHrine  (NEFFY ) 2 MG/0.1ML SOLN Place 2 mg into the nose as needed. 04/27/24   Iva Marty Saltness, MD  fluticasone  (FLONASE ) 50 MCG/ACT nasal spray Place 1 spray into both nostrils 2 (two) times daily. 04/08/24   Stuart Vernell Norris, PA-C  Fluticasone -Umeclidin-Vilant (TRELEGY ELLIPTA ) 200-62.5-25 MCG/ACT AEPB Inhale 1 puff into the lungs daily. 04/27/24   Iva Marty Saltness, MD  levocetirizine (XYZAL ) 5 MG tablet Take 1 tablet (5 mg total) by mouth every evening. 04/27/24   Iva Marty Saltness, MD  mupirocin  ointment (BACTROBAN ) 2 % Apply 1 Application topically 2 (two) times daily. 04/22/24   Stuart Vernell Norris, PA-C    Allergies: Lactose intolerance (gi) and Peanut-containing drug products    Review of Systems  All other systems reviewed and are negative.   Updated Vital Signs BP (!) 150/86   Pulse (!) 106   Temp 97.8 F (36.6 C)   Resp 18   Ht 1.829 m (6')   Wt 97.1 kg   SpO2 97%   BMI 29.02 kg/m    Physical Exam Vitals and nursing note reviewed.  Constitutional:      General: He is not in acute distress.    Appearance: He is well-developed.  HENT:     Head: Normocephalic and atraumatic.     Mouth/Throat:     Pharynx: No oropharyngeal exudate.  Eyes:     General: No scleral icterus.       Right eye: No discharge.        Left eye: No discharge.     Conjunctiva/sclera: Conjunctivae normal.     Pupils: Pupils are equal, round, and reactive to light.  Neck:     Thyroid: No thyromegaly.     Vascular: No JVD.  Cardiovascular:     Rate and Rhythm: Normal rate and regular rhythm.     Heart sounds: Normal heart sounds. No murmur heard.    No friction rub. No gallop.  Pulmonary:     Effort: Pulmonary effort is normal. No respiratory distress.     Breath sounds: Normal breath sounds. No wheezing or rales.  Abdominal:     General: Bowel sounds are normal. There is no distension.     Palpations: Abdomen is soft. There is no mass.     Tenderness: There is no abdominal tenderness.  Musculoskeletal:        General: Tenderness and signs of injury present. Normal range of motion.  Cervical back: Normal range of motion and neck supple.     Right lower leg: No edema.     Left lower leg: No edema.     Comments: Decreased range of motion of the left knee but he is able to straight leg raise and his extensor mechanism is intact.  His quad is soft, calf is soft, good blood flow distally.  The knee joint does not appear to have a significant effusion but there is tenderness with any manipulation of the patella  Lymphadenopathy:     Cervical: No cervical adenopathy.  Skin:    General: Skin is warm and dry.     Findings: No erythema or rash.  Neurological:     Mental Status: He is alert.     Coordination: Coordination normal.  Psychiatric:        Behavior: Behavior normal.     (all labs ordered are listed, but only abnormal results are displayed) Labs Reviewed - No data to  display  EKG: None  Radiology: DG Knee Complete 4 Views Left Result Date: 05/24/2024 EXAM: 4 VIEW(S) XRAY OF THE LEFT KNEE 05/24/2024 09:33:00 PM COMPARISON: 10/07/2022 CLINICAL HISTORY: Trauma. Per triage Pt stated that he was playing basketball and came down wrong on his left knee and heard a pop. Pt had knee surgery in January. FINDINGS: BONES AND JOINTS: No acute fracture. No focal osseous lesion. No joint dislocation. No significant joint effusion. No significant degenerative changes. SOFT TISSUES: The soft tissues are unremarkable. IMPRESSION: 1. No acute fracture or dislocation. Electronically signed by: Norman Gatlin MD 05/24/2024 09:40 PM EDT RP Workstation: [id number]     Procedures   Medications Ordered in the ED  ketorolac  (TORADOL ) injection 60 mg (60 mg Intramuscular Given 05/24/24 2131)                                    Medical Decision Making Amount and/or Complexity of Data Reviewed Radiology: ordered.  Risk Prescription drug management.   The patient does not appear to be in distress but is uncomfortable, he has requested something for pain, Toradol  ordered, x-ray ordered of the knee, suspect that there is some injury whether it is an internal derangement or bony abnormality.  He is agreeable to the x-ray.   Radiology Imaging: I personally viewed the images of the ordered radiographic studies and find no acute findings of fractures or dislocations I agree with the radiologist interpretation as well   I have discussed with the patient at the bedside the results, and the meaning of these results.  They have had opportunity to ask questions,  expressed their understanding to the need for follow-up with primary care physician  Nursing placed knee immobilizer and crutches, neurovascularly intact, ambulatory with no assistance, home with anti-inflammatory     Final diagnoses:  Acute pain of left knee    ED Discharge Orders          Ordered    Meloxicam  15 MG  TBDP  Daily        05/24/24 2155               Cleotilde Rogue, MD 05/24/24 2157

## 2024-05-24 NOTE — ED Triage Notes (Signed)
 Pt stated that he was playing basketball and came down wrong on his left knee and heard a pop. Pt had knee surgery in January.

## 2024-08-06 NOTE — Patient Instructions (Incomplete)
 Asthma Continue albuterol  2 puffs once every 4 hours if needed for cough or wheeze You may use albuterol  2 puffs 5 to 15 minutes before activity to decrease cough or wheeze For asthma flare, begin Trelegy 100-1 puff once a day for 1 to 2 weeks or until cough and wheeze free, then stop  Chronic rhinitis Continue Flonase  2 sprays in each nostril once a day if needed for stuffy nose.  In the right nostril, point the applicator out toward the right ear. In the left nostril, point the applicator out toward the left ear Levocetirizine 5 mg once a day if needed for a runny nose or itch. Consider saline nasal rinses as needed for nasal symptoms. Use this before any medicated nasal sprays for best result Get the lab work or have skin testing to help us  evaluate your environmental allergies  Food allergy Continue to avoid peanuts and tree nuts.  In case of an allergic reaction, give Benadryl  *** {Blank single:19197::teaspoonful,teaspoonfuls,capsules} every {blank single:19197::4,6} hours, and if life-threatening symptoms occur, inject with {Blank single:19197::EpiPen 0.3 mg,EpiPen Jr. 0.15 mg,AuviQ 0.3 mg,AuviQ 0.15 mg,AuviQ 0.10 mg}. Will get the lab work or have skin testing to help us  evaluate your food allergies  Call the clinic if this treatment plan is not working well for you.  Follow up in *** or sooner if needed.

## 2024-08-06 NOTE — Progress Notes (Deleted)
   [address] [place] [address] Dept: [phone]  FOLLOW UP NOTE  Patient ID: Spencer Hall, male    DOB: November 12, 2000  Age: 23 y.o. MRN: [id number] Date of Office Visit: 08/07/2024  Assessment  Chief Complaint: No chief complaint on file.  HPI Spencer Hall is a 23 year old male who presents to the clinic for follow-up visit.  He was last seen in this clinic on 02/26/2024 as a new patient by Dr. Iva for evaluation of asthma, allergic rhinitis, and food allergy to peanuts and tree nuts.  He did not get lab work that was ordered at that visit.  Discussed the use of AI scribe software for clinical note transcription with the patient, who gave verbal consent to proceed.  History of Present Illness      Drug Allergies:  Allergies  Allergen Reactions   Lactose Intolerance (Gi) Other (See Comments)   Peanut-Containing Drug Products Swelling    Physical Exam: There were no vitals taken for this visit.   Physical Exam  Diagnostics:    Assessment and Plan: No diagnosis found.  No orders of the defined types were placed in this encounter.   There are no Patient Instructions on file for this visit.  No follow-ups on file.    Thank you for the opportunity to care for this patient.  Please do not hesitate to contact me with questions.  Arlean Mutter, FNP [facility]

## 2024-08-07 ENCOUNTER — Ambulatory Visit: Admitting: Family Medicine

## 2024-08-07 DIAGNOSIS — J309 Allergic rhinitis, unspecified: Secondary | ICD-10-CM
